# Patient Record
Sex: Male | Born: 1945
Health system: Northeastern US, Academic
[De-identification: ages and names within clinical notes are randomized; demographics above are authoritative.]

## PROBLEM LIST (undated history)

## (undated) DIAGNOSIS — Z85528 Personal history of other malignant neoplasm of kidney: Secondary | ICD-10-CM

## (undated) DIAGNOSIS — M1990 Unspecified osteoarthritis, unspecified site: Secondary | ICD-10-CM

## (undated) DIAGNOSIS — N19 Unspecified kidney failure: Secondary | ICD-10-CM

## (undated) DIAGNOSIS — E785 Hyperlipidemia, unspecified: Secondary | ICD-10-CM

## (undated) DIAGNOSIS — G4733 Obstructive sleep apnea (adult) (pediatric): Secondary | ICD-10-CM

## (undated) DIAGNOSIS — J449 Chronic obstructive pulmonary disease, unspecified: Secondary | ICD-10-CM

## (undated) DIAGNOSIS — I10 Essential (primary) hypertension: Secondary | ICD-10-CM

## (undated) HISTORY — PX: REVISION TOTAL HIP ARTHROPLASTY: SHX766

## (undated) HISTORY — PX: TONSILLECTOMY: SUR1361

## (undated) HISTORY — DX: Personal history of other malignant neoplasm of kidney: Z85.528

## (undated) HISTORY — DX: Hyperlipidemia, unspecified: E78.5

## (undated) HISTORY — DX: Obstructive sleep apnea (adult) (pediatric): G47.33

## (undated) HISTORY — DX: Unspecified kidney failure: N19

## (undated) HISTORY — DX: Essential (primary) hypertension: I10

## (undated) HISTORY — DX: Unspecified osteoarthritis, unspecified site: M19.90

## (undated) HISTORY — DX: Chronic obstructive pulmonary disease, unspecified: J44.9

---

## 1998-02-11 ENCOUNTER — Encounter: Admission: RE | Admit: 1998-02-11 | Discharge: 1998-05-12 | Payer: Self-pay | Admitting: *Deleted

## 1998-05-01 ENCOUNTER — Encounter: Admission: RE | Admit: 1998-05-01 | Discharge: 1998-07-30 | Payer: Self-pay | Admitting: *Deleted

## 1999-11-23 ENCOUNTER — Ambulatory Visit (HOSPITAL_COMMUNITY): Admission: RE | Admit: 1999-11-23 | Discharge: 1999-11-23 | Payer: Self-pay | Admitting: Orthopedic Surgery

## 1999-11-23 ENCOUNTER — Encounter: Payer: Self-pay | Admitting: Orthopedic Surgery

## 2000-02-16 ENCOUNTER — Encounter: Payer: Self-pay | Admitting: Specialist

## 2000-02-22 ENCOUNTER — Encounter: Payer: Self-pay | Admitting: Orthopedic Surgery

## 2000-02-22 ENCOUNTER — Inpatient Hospital Stay (HOSPITAL_COMMUNITY): Admission: RE | Admit: 2000-02-22 | Discharge: 2000-02-25 | Payer: Self-pay | Admitting: Orthopedic Surgery

## 2000-02-25 ENCOUNTER — Inpatient Hospital Stay (HOSPITAL_COMMUNITY)
Admission: RE | Admit: 2000-02-25 | Discharge: 2000-02-29 | Payer: Self-pay | Admitting: Physical Medicine & Rehabilitation

## 2000-02-29 ENCOUNTER — Encounter: Payer: Self-pay | Admitting: Orthopedic Surgery

## 2001-03-27 ENCOUNTER — Inpatient Hospital Stay (HOSPITAL_COMMUNITY): Admission: EM | Admit: 2001-03-27 | Discharge: 2001-03-31 | Payer: Self-pay | Admitting: Emergency Medicine

## 2001-03-27 ENCOUNTER — Encounter: Payer: Self-pay | Admitting: Internal Medicine

## 2001-03-29 ENCOUNTER — Encounter: Payer: Self-pay | Admitting: Internal Medicine

## 2003-05-01 ENCOUNTER — Ambulatory Visit (HOSPITAL_BASED_OUTPATIENT_CLINIC_OR_DEPARTMENT_OTHER): Admission: RE | Admit: 2003-05-01 | Discharge: 2003-05-01 | Payer: Self-pay | Admitting: Orthopedic Surgery

## 2003-05-28 ENCOUNTER — Ambulatory Visit (HOSPITAL_COMMUNITY): Admission: RE | Admit: 2003-05-28 | Discharge: 2003-05-28 | Payer: Self-pay | Admitting: Orthopedic Surgery

## 2003-06-03 ENCOUNTER — Encounter: Payer: Self-pay | Admitting: Orthopedic Surgery

## 2003-06-03 ENCOUNTER — Inpatient Hospital Stay (HOSPITAL_COMMUNITY): Admission: RE | Admit: 2003-06-03 | Discharge: 2003-06-07 | Payer: Self-pay | Admitting: Orthopedic Surgery

## 2003-06-06 ENCOUNTER — Encounter: Payer: Self-pay | Admitting: Orthopaedic Surgery

## 2003-06-07 ENCOUNTER — Inpatient Hospital Stay (HOSPITAL_COMMUNITY)
Admission: RE | Admit: 2003-06-07 | Discharge: 2003-06-13 | Payer: Self-pay | Admitting: Physical Medicine & Rehabilitation

## 2003-06-09 ENCOUNTER — Encounter: Payer: Self-pay | Admitting: Physical Medicine & Rehabilitation

## 2003-06-19 ENCOUNTER — Ambulatory Visit (HOSPITAL_COMMUNITY): Admission: RE | Admit: 2003-06-19 | Discharge: 2003-06-19 | Payer: Self-pay | Admitting: Orthopedic Surgery

## 2004-02-21 HISTORY — PX: SHOULDER SURGERY: SHX246

## 2004-03-03 ENCOUNTER — Ambulatory Visit (HOSPITAL_COMMUNITY): Admission: RE | Admit: 2004-03-03 | Discharge: 2004-03-04 | Payer: Self-pay | Admitting: Orthopedic Surgery

## 2005-08-23 HISTORY — PX: TOTAL KNEE ARTHROPLASTY: SHX125

## 2008-08-23 HISTORY — PX: NEPHRECTOMY: SHX65

## 2009-07-09 ENCOUNTER — Encounter: Admission: RE | Admit: 2009-07-09 | Discharge: 2009-07-09 | Payer: Self-pay | Admitting: Orthopaedic Surgery

## 2010-04-06 ENCOUNTER — Ambulatory Visit (HOSPITAL_COMMUNITY): Admission: RE | Admit: 2010-04-06 | Discharge: 2010-04-06 | Payer: Self-pay | Admitting: Orthopedic Surgery

## 2010-04-15 ENCOUNTER — Encounter: Admission: RE | Admit: 2010-04-15 | Discharge: 2010-04-15 | Payer: Self-pay | Admitting: Nephrology

## 2010-04-17 ENCOUNTER — Inpatient Hospital Stay (HOSPITAL_COMMUNITY): Admission: RE | Admit: 2010-04-17 | Discharge: 2010-04-27 | Payer: Self-pay | Admitting: Orthopedic Surgery

## 2010-04-17 ENCOUNTER — Ambulatory Visit: Payer: Self-pay | Admitting: Emergency Medicine

## 2010-06-10 ENCOUNTER — Emergency Department (HOSPITAL_COMMUNITY): Admission: EM | Admit: 2010-06-10 | Discharge: 2010-06-10 | Payer: Self-pay | Admitting: Emergency Medicine

## 2010-08-04 ENCOUNTER — Encounter
Admission: RE | Admit: 2010-08-04 | Discharge: 2010-09-22 | Payer: Self-pay | Source: Home / Self Care | Attending: Internal Medicine | Admitting: Internal Medicine

## 2010-09-12 ENCOUNTER — Encounter: Payer: Self-pay | Admitting: Orthopedic Surgery

## 2010-11-04 LAB — SYNOVIAL CELL COUNT + DIFF, W/ CRYSTALS
Eosinophils-Synovial: 0 % (ref 0–1)
Lymphocytes-Synovial Fld: 1 % (ref 0–20)
Neutrophil, Synovial: 82 % — ABNORMAL HIGH (ref 0–25)
Other Cells-SYN: 0

## 2010-11-04 LAB — CBC
MCH: 27.9 pg (ref 26.0–34.0)
MCHC: 31.9 g/dL (ref 30.0–36.0)
MCV: 87.5 fL (ref 78.0–100.0)
Platelets: 227 10*3/uL (ref 150–400)
RDW: 14.6 % (ref 11.5–15.5)
WBC: 12.3 10*3/uL — ABNORMAL HIGH (ref 4.0–10.5)

## 2010-11-04 LAB — DIFFERENTIAL
Basophils Absolute: 0 10*3/uL (ref 0.0–0.1)
Basophils Relative: 0 % (ref 0–1)
Eosinophils Absolute: 0.1 10*3/uL (ref 0.0–0.7)
Eosinophils Relative: 1 % (ref 0–5)
Monocytes Absolute: 1.3 10*3/uL — ABNORMAL HIGH (ref 0.1–1.0)
Neutro Abs: 9 10*3/uL — ABNORMAL HIGH (ref 1.7–7.7)

## 2010-11-04 LAB — BODY FLUID CULTURE: Culture: NO GROWTH

## 2010-11-05 LAB — GLUCOSE, CAPILLARY
Glucose-Capillary: 144 mg/dL — ABNORMAL HIGH (ref 70–99)
Glucose-Capillary: 152 mg/dL — ABNORMAL HIGH (ref 70–99)
Glucose-Capillary: 167 mg/dL — ABNORMAL HIGH (ref 70–99)
Glucose-Capillary: 193 mg/dL — ABNORMAL HIGH (ref 70–99)
Glucose-Capillary: 193 mg/dL — ABNORMAL HIGH (ref 70–99)
Glucose-Capillary: 200 mg/dL — ABNORMAL HIGH (ref 70–99)
Glucose-Capillary: 237 mg/dL — ABNORMAL HIGH (ref 70–99)
Glucose-Capillary: 246 mg/dL — ABNORMAL HIGH (ref 70–99)
Glucose-Capillary: 260 mg/dL — ABNORMAL HIGH (ref 70–99)

## 2010-11-05 LAB — CBC
HCT: 27.3 % — ABNORMAL LOW (ref 39.0–52.0)
HCT: 28.2 % — ABNORMAL LOW (ref 39.0–52.0)
Hemoglobin: 9 g/dL — ABNORMAL LOW (ref 13.0–17.0)
Hemoglobin: 9.3 g/dL — ABNORMAL LOW (ref 13.0–17.0)
MCHC: 32.7 g/dL (ref 30.0–36.0)
MCV: 83.5 fL (ref 78.0–100.0)
MCV: 83.9 fL (ref 78.0–100.0)
MCV: 85.2 fL (ref 78.0–100.0)
Platelets: 156 10*3/uL (ref 150–400)
Platelets: 203 10*3/uL (ref 150–400)
Platelets: 252 10*3/uL (ref 150–400)
RBC: 3.25 MIL/uL — ABNORMAL LOW (ref 4.22–5.81)
RBC: 3.27 MIL/uL — ABNORMAL LOW (ref 4.22–5.81)
RBC: 3.31 MIL/uL — ABNORMAL LOW (ref 4.22–5.81)
RBC: 3.36 MIL/uL — ABNORMAL LOW (ref 4.22–5.81)
WBC: 10 10*3/uL (ref 4.0–10.5)
WBC: 8.1 10*3/uL (ref 4.0–10.5)
WBC: 8.4 10*3/uL (ref 4.0–10.5)

## 2010-11-05 LAB — BASIC METABOLIC PANEL
Calcium: 8.3 mg/dL — ABNORMAL LOW (ref 8.4–10.5)
Chloride: 106 mEq/L (ref 96–112)
Chloride: 109 mEq/L (ref 96–112)
Chloride: 110 mEq/L (ref 96–112)
Creatinine, Ser: 2.12 mg/dL — ABNORMAL HIGH (ref 0.4–1.5)
Creatinine, Ser: 2.57 mg/dL — ABNORMAL HIGH (ref 0.4–1.5)
Creatinine, Ser: 2.78 mg/dL — ABNORMAL HIGH (ref 0.4–1.5)
GFR calc Af Amer: 28 mL/min — ABNORMAL LOW (ref >60)
GFR calc Af Amer: 31 mL/min — ABNORMAL LOW (ref >60)
GFR calc Af Amer: 31 mL/min — ABNORMAL LOW (ref >60)
GFR calc Af Amer: 38 mL/min — ABNORMAL LOW (ref >60)
GFR calc non Af Amer: 25 mL/min — ABNORMAL LOW (ref >60)
Glucose, Bld: 169 mg/dL — ABNORMAL HIGH (ref 70–99)
Potassium: 4.1 mEq/L (ref 3.5–5.1)
Potassium: 4.1 mEq/L (ref 3.5–5.1)
Potassium: 4.2 mEq/L (ref 3.5–5.1)
Sodium: 134 mEq/L — ABNORMAL LOW (ref 135–145)
Sodium: 135 mEq/L (ref 135–145)

## 2010-11-05 LAB — PROTIME-INR
INR: 1.78 — ABNORMAL HIGH (ref 0.00–1.49)
Prothrombin Time: 20.9 seconds — ABNORMAL HIGH (ref 11.6–15.2)

## 2010-11-06 LAB — CBC
HCT: 31 % — ABNORMAL LOW (ref 39.0–52.0)
HCT: 34.7 % — ABNORMAL LOW (ref 39.0–52.0)
HCT: 41.4 % (ref 39.0–52.0)
HCT: 44.4 % (ref 39.0–52.0)
Hemoglobin: 11.2 g/dL — ABNORMAL LOW (ref 13.0–17.0)
Hemoglobin: 12.4 g/dL — ABNORMAL LOW (ref 13.0–17.0)
Hemoglobin: 13.6 g/dL (ref 13.0–17.0)
Hemoglobin: 14.7 g/dL (ref 13.0–17.0)
MCH: 27.5 pg (ref 26.0–34.0)
MCH: 27.9 pg (ref 26.0–34.0)
MCH: 28.3 pg (ref 26.0–34.0)
MCH: 28.5 pg (ref 26.0–34.0)
MCH: 28.8 pg (ref 26.0–34.0)
MCHC: 31.3 g/dL (ref 30.0–36.0)
MCHC: 32.3 g/dL (ref 30.0–36.0)
MCHC: 32.9 g/dL (ref 30.0–36.0)
MCHC: 33.1 g/dL (ref 30.0–36.0)
MCV: 85.4 fL (ref 78.0–100.0)
MCV: 86.6 fL (ref 78.0–100.0)
MCV: 87 fL (ref 78.0–100.0)
MCV: 87.6 fL (ref 78.0–100.0)
Platelets: 171 10*3/uL (ref 150–400)
RBC: 4.44 MIL/uL (ref 4.22–5.81)
RDW: 13.1 % (ref 11.5–15.5)
RDW: 13.6 % (ref 11.5–15.5)
RDW: 13.3 % (ref 11.5–15.5)
WBC: 15.1 10*3/uL — ABNORMAL HIGH (ref 4.0–10.5)

## 2010-11-06 LAB — BASIC METABOLIC PANEL
BUN: 23 mg/dL (ref 6–23)
BUN: 25 mg/dL — ABNORMAL HIGH (ref 6–23)
CO2: 23 mEq/L (ref 19–32)
CO2: 23 mEq/L (ref 19–32)
CO2: 25 mEq/L (ref 19–32)
Calcium: 8.1 mg/dL — ABNORMAL LOW (ref 8.4–10.5)
Calcium: 8.3 mg/dL — ABNORMAL LOW (ref 8.4–10.5)
Chloride: 106 mEq/L (ref 96–112)
Chloride: 109 mEq/L (ref 96–112)
Chloride: 114 mEq/L — ABNORMAL HIGH (ref 96–112)
Creatinine, Ser: 1.7 mg/dL — ABNORMAL HIGH (ref 0.4–1.5)
Creatinine, Ser: 1.87 mg/dL — ABNORMAL HIGH (ref 0.4–1.5)
GFR calc Af Amer: 44 mL/min — ABNORMAL LOW (ref >60)
GFR calc non Af Amer: 29 mL/min — ABNORMAL LOW (ref >60)
Glucose, Bld: 137 mg/dL — ABNORMAL HIGH (ref 70–99)
Glucose, Bld: 185 mg/dL — ABNORMAL HIGH (ref 70–99)
Glucose, Bld: 219 mg/dL — ABNORMAL HIGH (ref 70–99)
Potassium: 5.2 mEq/L — ABNORMAL HIGH (ref 3.5–5.1)
Sodium: 138 mEq/L (ref 135–145)

## 2010-11-06 LAB — CROSSMATCH
ABO/RH(D): A POS
Antibody Screen: NEGATIVE

## 2010-11-06 LAB — GLUCOSE, CAPILLARY
Glucose-Capillary: 101 mg/dL — ABNORMAL HIGH (ref 70–99)
Glucose-Capillary: 141 mg/dL — ABNORMAL HIGH (ref 70–99)
Glucose-Capillary: 141 mg/dL — ABNORMAL HIGH (ref 70–99)
Glucose-Capillary: 159 mg/dL — ABNORMAL HIGH (ref 70–99)
Glucose-Capillary: 187 mg/dL — ABNORMAL HIGH (ref 70–99)
Glucose-Capillary: 193 mg/dL — ABNORMAL HIGH (ref 70–99)
Glucose-Capillary: 226 mg/dL — ABNORMAL HIGH (ref 70–99)
Glucose-Capillary: 243 mg/dL — ABNORMAL HIGH (ref 70–99)
Glucose-Capillary: 247 mg/dL — ABNORMAL HIGH (ref 70–99)
Glucose-Capillary: 271 mg/dL — ABNORMAL HIGH (ref 70–99)
Glucose-Capillary: 322 mg/dL — ABNORMAL HIGH (ref 70–99)

## 2010-11-06 LAB — COMPREHENSIVE METABOLIC PANEL
ALT: 24 U/L (ref 0–53)
BUN: 51 mg/dL — ABNORMAL HIGH (ref 6–23)
BUN: 75 mg/dL — ABNORMAL HIGH (ref 6–23)
CO2: 22 mEq/L (ref 19–32)
CO2: 20 mEq/L (ref 19–32)
Calcium: 9.7 mg/dL (ref 8.4–10.5)
Calcium: 9.6 mg/dL (ref 8.4–10.5)
Creatinine, Ser: 2.12 mg/dL — ABNORMAL HIGH (ref 0.4–1.5)
Creatinine, Ser: 3 mg/dL — ABNORMAL HIGH (ref 0.4–1.5)
GFR calc Af Amer: 38 mL/min — ABNORMAL LOW (ref >60)
GFR calc non Af Amer: 32 mL/min — ABNORMAL LOW (ref >60)
GFR calc non Af Amer: 21 mL/min — ABNORMAL LOW (ref >60)
Glucose, Bld: 323 mg/dL — ABNORMAL HIGH (ref 70–99)
Glucose, Bld: 58 mg/dL — ABNORMAL LOW (ref 70–99)
Sodium: 137 mEq/L (ref 135–145)
Total Bilirubin: 0.7 mg/dL (ref 0.3–1.2)
Total Protein: 7.6 g/dL (ref 6.0–8.3)

## 2010-11-06 LAB — DIFFERENTIAL
Eosinophils Absolute: 0.3 10*3/uL (ref 0.0–0.7)
Lymphocytes Relative: 23 % (ref 12–46)
Lymphs Abs: 1.5 10*3/uL (ref 0.7–4.0)
Lymphs Abs: 2.5 10*3/uL (ref 0.7–4.0)
Monocytes Absolute: 0.6 10*3/uL (ref 0.1–1.0)
Monocytes Relative: 7 % (ref 3–12)
Monocytes Relative: 10 % (ref 3–12)
Neutro Abs: 5.6 10*3/uL (ref 1.7–7.7)
Neutro Abs: 7 10*3/uL (ref 1.7–7.7)
Neutrophils Relative %: 72 % (ref 43–77)
Neutrophils Relative %: 64 % (ref 43–77)

## 2010-11-06 LAB — RENAL FUNCTION PANEL
Albumin: 3.3 g/dL — ABNORMAL LOW (ref 3.5–5.2)
BUN: 26 mg/dL — ABNORMAL HIGH (ref 6–23)
BUN: 28 mg/dL — ABNORMAL HIGH (ref 6–23)
CO2: 24 mEq/L (ref 19–32)
CO2: 24 mEq/L (ref 19–32)
Chloride: 110 mEq/L (ref 96–112)
Chloride: 111 mEq/L (ref 96–112)
Creatinine, Ser: 1.74 mg/dL — ABNORMAL HIGH (ref 0.4–1.5)
Glucose, Bld: 250 mg/dL — ABNORMAL HIGH (ref 70–99)
Potassium: 4.9 mEq/L (ref 3.5–5.1)

## 2010-11-06 LAB — PROTIME-INR
INR: 0.99 (ref 0.00–1.49)
INR: 1.53 — ABNORMAL HIGH (ref 0.00–1.49)
INR: 1.06 (ref 0.00–1.49)
Prothrombin Time: 18.6 seconds — ABNORMAL HIGH (ref 11.6–15.2)
Prothrombin Time: 14 seconds (ref 11.6–15.2)

## 2010-11-06 LAB — ABO/RH: ABO/RH(D): A POS

## 2010-11-06 LAB — POCT I-STAT 4, (NA,K, GLUC, HGB,HCT)
Hemoglobin: 14.6 g/dL (ref 13.0–17.0)
Potassium: 6.4 mEq/L (ref 3.5–5.1)
Sodium: 137 mEq/L (ref 135–145)

## 2010-11-06 LAB — APTT
aPTT: 25 seconds (ref 24–37)
aPTT: 26 seconds (ref 24–37)

## 2010-11-06 LAB — SURGICAL PCR SCREEN: Staphylococcus aureus: NEGATIVE

## 2010-11-16 ENCOUNTER — Telehealth: Payer: Self-pay | Admitting: Internal Medicine

## 2010-11-16 NOTE — Telephone Encounter (Signed)
Spoke with pt. He is c/o prod cough with blood tinged sputum and heaviness in chest x 4 days.  Would like appt.

## 2010-11-16 NOTE — Telephone Encounter (Signed)
Advised that the first available is in April and he needs to go to [facility] or ER for eval now.  He verbalized understanding and will go to UC or ER and appt for consult with MW sched for 12/03/10.

## 2010-12-03 ENCOUNTER — Ambulatory Visit (INDEPENDENT_AMBULATORY_CARE_PROVIDER_SITE_OTHER): Payer: Medicare Other | Admitting: Internal Medicine

## 2010-12-03 ENCOUNTER — Encounter: Payer: Self-pay | Admitting: Internal Medicine

## 2010-12-03 ENCOUNTER — Emergency Department (INDEPENDENT_AMBULATORY_CARE_PROVIDER_SITE_OTHER): Payer: Medicare Other

## 2010-12-03 ENCOUNTER — Emergency Department (HOSPITAL_BASED_OUTPATIENT_CLINIC_OR_DEPARTMENT_OTHER)
Admission: EM | Admit: 2010-12-03 | Discharge: 2010-12-03 | Disposition: A | Discharge status: Home / Self Care | Payer: Medicare Other | Attending: Emergency Medicine | Admitting: Emergency Medicine

## 2010-12-03 ENCOUNTER — Ambulatory Visit (INDEPENDENT_AMBULATORY_CARE_PROVIDER_SITE_OTHER)
Admission: RE | Admit: 2010-12-03 | Discharge: 2010-12-03 | Disposition: A | Discharge status: Home / Self Care | Payer: Medicare Other | Source: Ambulatory Visit | Attending: Internal Medicine | Admitting: Internal Medicine

## 2010-12-03 ENCOUNTER — Institutional Professional Consult (permissible substitution): Payer: Self-pay | Admitting: Internal Medicine

## 2010-12-03 VITALS — BP 108/64 | HR 102 | Temp 98.9°F | Ht 73.0 in | Wt 348.0 lb

## 2010-12-03 DIAGNOSIS — Y92009 Unspecified place in unspecified non-institutional (private) residence as the place of occurrence of the external cause: Secondary | ICD-10-CM | POA: Insufficient documentation

## 2010-12-03 DIAGNOSIS — J449 Chronic obstructive pulmonary disease, unspecified: Secondary | ICD-10-CM

## 2010-12-03 DIAGNOSIS — I10 Essential (primary) hypertension: Secondary | ICD-10-CM

## 2010-12-03 DIAGNOSIS — E119 Type 2 diabetes mellitus without complications: Secondary | ICD-10-CM | POA: Insufficient documentation

## 2010-12-03 DIAGNOSIS — M25529 Pain in unspecified elbow: Secondary | ICD-10-CM | POA: Insufficient documentation

## 2010-12-03 DIAGNOSIS — W010XXA Fall on same level from slipping, tripping and stumbling without subsequent striking against object, initial encounter: Secondary | ICD-10-CM

## 2010-12-03 DIAGNOSIS — Y93E5 Activity, floor mopping and cleaning: Secondary | ICD-10-CM | POA: Insufficient documentation

## 2010-12-03 DIAGNOSIS — S52123A Displaced fracture of head of unspecified radius, initial encounter for closed fracture: Secondary | ICD-10-CM | POA: Insufficient documentation

## 2010-12-03 DIAGNOSIS — Z79899 Other long term (current) drug therapy: Secondary | ICD-10-CM | POA: Insufficient documentation

## 2010-12-03 DIAGNOSIS — E7800 Pure hypercholesterolemia, unspecified: Secondary | ICD-10-CM | POA: Insufficient documentation

## 2010-12-03 MED ORDER — BUDESONIDE-FORMOTEROL FUMARATE 160-4.5 MCG/ACT IN AERO: 2.0000 | INHALATION_SPRAY | Freq: Two times a day (BID) | RESPIRATORY_TRACT | Status: DC

## 2010-12-03 NOTE — Progress Notes (Signed)
  Subjective:    Patient ID: Philip Cain, male    DOB: [birth year], 65 y.o.   MRN: [id number]  HPI 53 yowm quit smoking around 1996 with morbid obesity seen in consultation for sob sp  L Hip Aug 2011 and stopped ace and coreg > to rehab p surgery and improved.  12/03/2010 ov cc sob.  After rehab completed Sept 2011 could walk maybe an aisle at [employer] due to fatigue and sob/ slept ok s 02 most nights and no need for cpap on side ok.  Did not resume ace or coreg and gradually worse exp to wife's smoke at home, more cough congestion with white mucus indolent onset and slowly worse since discharge from rehab. No purulent sputum.  Marland Kitchenassoc with mild chest tightness, all symptoms some better w/in 15 min saba despite poor mdi.  Pt denies any significant sore throat, dysphagia, itching, sneezing,  nasal congestion or excess/ purulent secretions,  fever, chills, sweats, unintended wt loss, pleuritic or exertional cp, hempoptysis, orthopnea pnd or leg swelling.    Also denies any obvious fluctuation of symptoms with weather or environmental changes or other aggravating or alleviating factors.    Review of Systems  Constitutional: Negative for fever, chills, activity change, appetite change and unexpected weight change.  HENT: Negative for congestion, sore throat, rhinorrhea, sneezing, trouble swallowing, dental problem, voice change and postnasal drip.   Eyes: Negative for visual disturbance.  Respiratory: Positive for cough and shortness of breath. Negative for choking.   Cardiovascular: Positive for chest pain. Negative for leg swelling.  Gastrointestinal: Negative for nausea, vomiting and abdominal pain.  Genitourinary: Negative for difficulty urinating.  Musculoskeletal: Positive for arthralgias.  Skin: Negative for rash.  Psychiatric/Behavioral: Negative for behavioral problems and confusion.       Objective:   Physical Exam    obese wm nad  Wt 348 12/03/2010  HEENT mild turbinate  edema.  Oropharynx no thrush or excess pnd or cobblestoning.  No JVD or cervical adenopathy. Mild accessory muscle hypertrophy. Trachea midline, nl thryroid. Chest was hyperinflated by percussion with diminished breath sounds and moderate increased exp time without wheeze. Hoover sign positive at mid inspiration. Regular rate and rhythm without murmur gallop or rub or increase P2 or edema.  Abd:  Massively obese with huge pannus and no hsm . Ext warm without cyanosis or clubbing.      Assessment & Plan:

## 2010-12-03 NOTE — Patient Instructions (Addendum)
Symbicort 160 Take 2 puffs first thing in am and then another 2 puffs about 12 hours later.     Work on inhaler technique:  relax and gently blow all the way out then take a nice smooth deep breath back in, triggering the inhaler at same time you start breathing in.  Hold for up to 5 seconds if you can.  Rinse and gargle with water when done   If your mouth or throat starts to bother you,   I suggest you time the inhaler to your dental care and after using the inhaler(s) brush teeth and tongue with a baking soda containing toothpaste and when you rinse this out, gargle with it first to see if this helps your mouth and throat.     Please schedule a follow up office visit in 4  weeks, call sooner if needed with pft's on return

## 2010-12-03 NOTE — Assessment & Plan Note (Signed)
Strongly prefer in this setting: Bystolic, the most beta -1  selective Beta blocker available in sample form, with bisoprolol the most selective generic choice  on the market.  Otherwise risk spillover B2 effects on the airways

## 2010-12-03 NOTE — Assessment & Plan Note (Signed)
Very high risk pulmonary complications including osa/ ohs and gerd.

## 2010-12-03 NOTE — Assessment & Plan Note (Addendum)
  When respiratory symptoms begin well after a patient reports complete smoking cessation,  it is very hard to "blame" COPD specifically or airways disorders in general  ie it doesn't make any more sense than hearing a  [employer] driver wrecked his car while driving his kids to school or a surgeon sliced his hand off carving roast beef (it must be rare indeed!)     That is to say, once the high risk activity stops,  the symptoms should not suddenly erupt or markedly worsen.  If so, the differential diagnosis should include  obesity/deconditioning,  LPR/Reflux/Aspiration syndromes,  occult CHF, or  especially side effect of medications commonly used in this population.   Obesity and adverse med effects definitely contributed to symptoms in past ? Now as well?  For now work on hfa and start symbicort 2bid pending pft's   The proper method of use, as well as anticipated side effects, of this metered-dose inhaler are discussed and demonstrated to the patient. Improved to 50% with coaching

## 2010-12-04 ENCOUNTER — Telehealth: Payer: Self-pay | Admitting: Internal Medicine

## 2010-12-04 NOTE — Telephone Encounter (Signed)
Call pt: Reviewed cxr and no acute change so no change in recommendations made at [facility]  Advised pt of cxr results and he verbalized understanding

## 2011-01-06 ENCOUNTER — Ambulatory Visit: Payer: Medicare Other | Admitting: Internal Medicine

## 2011-01-08 NOTE — Discharge Summary (Signed)
[place]. [facility]  Patient:    Philip Cain, Philip Cain                         MRN: [id number] Adm. Date:  [id number] Disc. Date: [id number] Attending:  Judie Petit Dictator:   Cornell Barman, P.A. CC:         Valetta Mole. Swords, M.D. [facility]   Discharge Summary  DISCHARGE DIAGNOSES: 1. Bilateral pneumonia. 2. Respiratory insufficiency. 3. Hypoxemia. 4. Chronic obstructive pulmonary disease. 5. Non-insulin-dependent diabetes mellitus. 6. Hemoptysis.  HISTORY OF PRESENT ILLNESS:  Mr. Atkison is a 65 year old white male who presented with complaints of arthralgias, myalgias, chills, and sweats for the past five days.  He also developed cough and wheeze and feels very weak.  The patient started on Augmentin two days prior to this admission.  He had this left from a previous prescription.  The patient complains of cough productive of yellow sputum.  PAST MEDICAL HISTORY: 1. Non-insulin-dependent diabetes mellitus. 2. Hypertension. 3. Obesity. 4. Anxiety. 5. Hyperlipidemia. 6. 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  HOSPITAL COURSE: #1 - INFECTIOUS DISEASE:  The patient presented with fever and evidence of bilateral pneumonia on chest x-ray.  The patient also had some hemoptysis. The patient was hypoxic, requiring increased O2 to maintain saturations.  He was started on Rocephin and Zithromax.  The patients condition improved; however, he continued to spike fevers.  Blood cultures remained negative. Sputum cultures remained negative.  However, we did change his antibiotics from Rocephin and Zithromax to [place].  The patient defervesced on Fortaz. The patient described less hemoptysis and less dyspnea.  Currently, the patient is on room air and is maintaining saturations of 91-92% on room air. The patients respiratory insufficiency and hypoxemia were likely secondary to his bilateral pneumonia but exacerbated also by his COPD.  The patients condition  has improved, and he is felt to be stable for discharge home.  #2 - NON-INSULIN-DEPENDENT DIABETES MELLITUS:  The patient was on multiple oral hypoglycemic agents as an outpatient.  Many of these have been held during this hospitalization.  His Glucophage was held because he did receive a CT of the chest.  His Glucotrol was discontinued because he continued to have hypoglycemia.  Diabetic coordinator did talk with the patient and emphasized to the patient how well controlled his blood sugars were in the hospital by monitoring his food intake.  We do have him on reduced doses of his medications and will have him continue this regimen at discharge, however, anticipating he may need to have these medications increased and resumed.  DISCHARGE LABORATORY DATA:  Blood cultures remained negative x 2.  PSA was 0.16.  Hemoglobin 13.7.  CMET essentially normal.  Sputum culture was negative.  Chest CT showed no definite pulmonary embolus, but large areas of infiltrate with atelectasis and consolidation in the right lower lobe.  DISCHARGE MEDICATIONS: 1. Accupril 40 mg b.i.d. 2. Zoloft 50 mg q.d. 3. Glucophage 500 mg b.i.d. 4. Avandia 8 mg q.d. 5. Tiazac 300 mg q.h.s. 6. Guaifenesin 600 mg 2 tablets b.i.d. for six days. 7. Tequin 400 mg q.d. for six days. 8. The patient has been instructed not to take his Glucotrol until further    advised by Dr. Cato Mulligan.  DISCHARGE INSTRUCTIONS:  The patient is to check his blood cultures and document these for Dr. Cato Mulligan.  FOLLOW-UP:  The patient is to follow up with Dr. Cato Mulligan in one to two weeks.  DD:  03/31/01 TD:  04/01/01 Job: [id number]

## 2011-01-08 NOTE — Discharge Summary (Signed)
[place]. [facility]  Patient:    Philip Cain, Philip Cain                         MRN: [id number] Adm. Date:  [id number] Disc. 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  PRINCIPLE PROCEDURE:  Right total hip replacement.  Surgeon - Dr. Luiz Blare.  HISTORY OF PRESENT ILLNESS:  Philip Cain is a 65 year old male with severe right hip pain dating back to when he was a child when he had surgery for the hip x 2 - one to put some hardware in and one to remove it.  It sounds like he had a slipped capital femoral epiphysis although the details of this are not known.  MEDICATIONS ON ADMISSION:  Glucotrol, Glucophage, Avandia, Tiazac, Accupril, Paxil, Tylox, and Ambien.  PAST MEDICAL HISTORY:  Unremarkable other than obesity.  He had some mild history of hypertension.  COPD.  Non-insulin-dependent diabetes mellitus for which he occasionally takes insulin.  PHYSICAL EXAMINATION:  VITALS:  On admission, he was afebrile, his pulse was 82, respirations 16, blood pressure 126/60.  GENERAL:  He was well-developed, well-nourished, obese male in no acute distress.  HEENT:  PERRL, EOMI.  NECK:  Supple.  CHEST:  Clear to auscultation.  HEART:  Regular rate and rhythm.  ABDOMEN:  Soft and nontender.  He had well-healed scars and he was obese obviously.  EXTREMITIES:  He had tenderness to palpation with all range of motion of the hip and had internal rotation to about 0 degrees.  He had an obvious 15-degree flexion contracture and pain with all range of motion of the hip.  He had a Trendelenburg gait.  His skin was warm and dry.  HOSPITAL COURSE:  The patient was admitted to the hospital and underwent thorough preoperative evaluation for which he was found to be an operative candidate.  He was  taken to the operating room where he underwent a right total hip replacement.  It went uneventfully.  The patient postoperatively did extremely well.  He was up and moving around relatively quickly.  He did have some initial problems with control of his blood pressure and did have some problems with his potassium getting up relatively high to about 3.6.  He had initially had a bump in BUN and creatinine of 31 and 1.8.  These were trending down over the next several days, the patient did not need a blood transfusion, and his hematocrit leveled out with a hematocrit of 28.9 by the fourth postoperative day.  At that point, he was evaluated by the rehabilitation service and felt to be a good candidate.  At that point, he was able to bear moderate weight about 50% on the hip and was able to walk out to the door. The patient was transferred to the rehabilitation service at that point in excellent condition with a stable hemoglobin and hematocrit, potassium had resolved to 4.5, BUN and creatinine were down to 23 and 1.1 by the time of discharge to the rehabilitation service.  The patient will be followed up at two weeks at the office for staple removal and will be followed while he is in the hospital on the  rehabilitation service.  CONDITION ON DISCHARGE:  His condition at the time of discharge was stable.  DISCHARGE DIAGNOSIS:  Status post total hip replacement, doing well. DD:  04/15/00 TD:  04/16/00 Job: [id number]

## 2011-01-08 NOTE — Op Note (Signed)
NAMEJULEZ, Philip Cain                            ACCOUNT NO.:  [id number]   MEDICAL RECORD NO.:  [id number]                   PATIENT TYPE:  AMB   LOCATION:  DSC                                  FACILITY:  [facility]   PHYSICIAN:  Harvie Junior, M.D.                DATE OF BIRTH:  April 21, 1946   DATE OF PROCEDURE:  05/01/2003  DATE OF DISCHARGE:                                 OPERATIVE REPORT   PREOPERATIVE DIAGNOSIS:  1. Medial meniscal tear with degenerative joint disease and patellofemoral     chondromalacia.  2. Plantar fasciitis, left.   POSTOPERATIVE DIAGNOSIS:  1. Medial meniscal tear with degenerative joint disease and patellofemoral     chondromalacia.  2. Loose body.   OPERATION PERFORMED:  1. Partial posterior horn medial meniscectomy with debridement of medial     femoral compartment.  2. Debridement of chondromalacia patella on both the patellar and trochlear     side.  3. Removal of large osteocartilaginous loose body.  4. Injection of left plantar fascia.   SURGEON:  Harvie Junior, M.D.   ASSISTANT:  Marshia Ly, P.A.   ANESTHESIA:  General.   INDICATIONS FOR PROCEDURE:  The patient is a 65 year old male with a long  history of having significant pain in the left knee.  He had been treated  with anti-inflammatory medication, activity modification, injection therapy,  and all failed.  Because of this, he is brought to the operating room for  operative knee arthroscopy.   DESCRIPTION OF PROCEDURE:  The patient was brought to the operating room.  After adequate anesthesia was obtained with general anesthetic, the patient  was placed supine on the operating table.  The left leg was prepped and  draped in the usual sterile fashion.  Following this, routine arthroscopic  examination of the knee revealed that there was obvious grade 3 changes in  the patellofemoral joint.  This was debrided back to a smooth and stable  rim.  The plica was removed medially and  laterally.  Attention was turned to  the medial femoral compartment where there was noted to be some grade 4  changes on the medial femoral condyle about 4 square cm. over the medial  tibial plateau about 2 square cm.  There was a posterior horn medial  meniscal tear which was debrided.  Attention was turned to the notch where  there was a large osteocartilaginous loose body which was removed with a  grasper.  Attention was turned laterally where surprisingly, the lateral  compartment looked reasonably okay.  The meniscus was within normal limits  and the condyle showed some indentation but no evidence of frank break down.  Attention was turned back to the notch where the anterior cruciate was again  noted to be normal.  Medially, a final check was made with a probe  posteriorly.  The posterior horn had been debrided adequately.  The medial  femoral condyle and the medial tibial plateau were debrided although there  was that large bone-on-bone area that was discussed earlier.  Attention was  turned back up into the patellofemoral compartment where some debridement of  the trochlea and patella were again undertaken to try to smooth this area  out.  Following this, the knee was copiously irrigated and suctioned dry.  The arthroscopic portals were closed with bandage, sterile compressive  dressing was applied.  The patient was then transferred to the recovery room  where he was noted to be in satisfactory condition.  The estimated blood  loss for this procedure was none.                                                Harvie Junior, M.D.    Ranae Plumber  D:  05/01/2003  T:  05/01/2003  Job:  [id number]

## 2011-01-08 NOTE — Discharge Summary (Signed)
NAMEMYRLE, DUES                            ACCOUNT NO.:  [id number]   MEDICAL RECORD NO.:  [id number]                   PATIENT TYPE:  INP   LOCATION:  5022                                 FACILITY:  [facility]   PHYSICIAN:  Harvie Junior, M.D.                DATE OF BIRTH:  04-Jul-1946   DATE OF ADMISSION:  06/03/2003  DATE OF DISCHARGE:  06/07/2003                                 DISCHARGE SUMMARY   ADMISSION DIAGNOSES:  1. Degenerative joint disease, left knee.  2. Type 2 diabetes mellitus.  3. Hypertension.  4. Chronic depression.  5. Obesity.   DISCHARGE DIAGNOSES:  1. Degenerative joint disease, left knee.  2. Type 2 diabetes mellitus.  3. Hypertension.  4. Chronic depression.  5. Obesity.   PROCEDURES IN HOSPITAL:  Left total knee arthroplasty, Jodi Geralds, MD, on  June 03, 2003.   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  He has pain with  ambulation and night pain.  He had left knee arthroscopy back in September.  These showed significant DJD.  Standing x-rays of the left knee showed bone-  on-bone arthritis.  He got really no relief and actually worsening of the  knee pain after the arthroscopy.  He had night pain and pain with ambulation  and basically could not walk without significant pain.  There were no signs  of infection or other untoward features after his knee arthroscopy, and he  was felt to be a candidate for a left total knee arthroplasty.  He was  admitted for this.   PERTINENT LABORATORY STUDIES:  Preop hemoglobin was 13.3 with hematocrit of  39.  Blood pressure 128/71.  His potassium was 4.6.  Urinalysis showed no  abnormalities.   On postop day #1, his hemoglobin was 11.9 with a hematocrit of 35.2.  Potassium was 4.9.  Sodium 141.  BUN 29, creatinine 1.1.  His pro time was  14.1 seconds with an INR of 1.1.  On postop day #2, his hemoglobin was 10.7  with a  hematocrit of 31.3.  INR was 1.3.   On postop day #3, his hemoglobin was 9.5 with an INR of 1.6.   HOSPITAL COURSE:  The patient underwent left total knee arthroplasty, as was  described in Dr. Luiz Blare' operative note on June 03, 2003.  Postoperatively, he was put on IV Ancef 1 gm q.8h. x6 doses, IV fluids, and  PCA morphine pump for pain control.  He is also started on Coumadin and  Lovenox for DVT prophylaxis, Lovenox until the INR was 2.0 or greater.  Incentive spirometry was ordered.   On postop day #1, he had moderate left knee pain.  He was taking fluids  without difficulty.  His Amos catheter was intact.  His vital signs were  stable.  He was  afebrile.  He had 450 cc of Hemovac output.  Physical  therapy saw the patient for walker ambulation, weightbearing as tolerated,  on the left side.   On postop day #2, he had complaints of knee pain.  He was out of bed to the  chair.  He was taking fluids without difficulty.  He was afebrile.  His  vital signs were stable.  His dressing was changed to the left knee, and the  Hemovac drains were pulled.  His IV was converted to a saline lock.  His  Arns catheter was discontinued.  He was started on OxyContin 40 mg b.i.d.   He continued to progress well.  They apparently had a concern as to whether  the patient was in bed asleep.  The CPM was on his left leg, and the machine  fell to the floor with patient's leg attached.  He has had some increased  knee pain since but overall really no change.  We got x-rays of the left  knee, which were unremarkable for any acute abnormalities.  He continued to  make excellent progress but was felt to be a good candidate for inpatient  rehab stay.   On June 07, 2003, the patient was making good progress.  Was felt to be a  candidate for inpatient rehabilitation.  He was admitted to the inpatient  unit.   DISCHARGE MEDICATIONS:  All medications preoperatively with the addition of  Coumadin per  pharmacy protocol, OxyContin 40 mg b.i.d., Percocet for  breakthrough pain.   ACTIVITY:  Weightbearing as tolerated on the left with a walker.  We will  see the patient on the inpatient unit.      Marshia Ly, P.A.                       Harvie Junior, M.D.    Cordelia Pen  D:  09/05/2003  T:  09/05/2003  Job:  [id number]   cc:   [facility], [place]

## 2011-01-08 NOTE — Discharge Summary (Signed)
[place]. [facility]  Patient:    Philip Cain, Philip Cain                         MRN: [id number] Adm. Date:  [id number] Disc. Date: [id number] Attending:  Herold Harms Dictator:   Bynum Bellows. Idacavage, P.A.C. CC:         Daniel L. Thomasena Edis, M.D.  Harvie Junior, M.D.  [facility], [facility]   Discharge Summary  DIAGNOSES: 1. Right total hip arthroplasty. 2. Hypertension. 3. Diabetes. 4. Obesity. 5. Postoperative anemia.  HISTORY OF PRESENT ILLNESS:  The patient is a 65 year old male with severe right hip pain, status post remote hip pinning and removal of hardware in 1961 and 1963, who elected to undergo right total hip arthroplasty February 22, 2000, by Dr. Luiz Blare.  He was placed on Coumadin for DVT prophylaxis.  He was allowed weightbearing as tolerated.  He did have acute renal insufficiency.  His Glucophage was held.  He did require IV fluids.  It was felt that he would require inpatient rehabilitation.  PAST MEDICAL HISTORY: 1. Hypertension. 2. Diabetes. 3. Obesity.  SOCIAL HISTORY:  The patient lives with his wife in a two-level house.  He can stay on the ground floor.  He does have three steps to enter.  HOSPITAL COURSE:  The patient was [facility] to [facility] on February 25, 2000.  At that time he was moderate assist with bed mobility and supervision for transfers.  He was also able to ambulate 10 feet with a wide walker.  Upon admission, he was evaluated by physical therapy and occupational therapies, and he was placed in a program in which he could receive at least three hours per day of physical and occupational therapies. The patient had no significant medical complications while he has been on the rehabilitation floor.  He progressed quite well with therapies, to the point where he was independent with bed mobility and transfers and able to ambulate 100 feet with a walker at an independent level.  His medications at  the time of discharge include:  DISCHARGE MEDICATIONS: 1. Trinsicon three times a day. 2. Percocet 1 or 2 every four hours as needed. 3. Coumadin 10 mg daily until March 21, 2000.  4. OxyContin 30 mg daily for seven days, then 20 mg daily for seven days,     then 10 mg daily for seven days.  5. Accupril 20 mg daily.  6. Tiazac 300 mg every 12 hours.  7. Paxil 20 mg daily.  8. Glucotrol XL 5 mg twice a day.  9. Glucophage XR 500 mg twice a day. 10. Avandia 8 mg daily.  ACTIVITY:  He is instructed to use a walker when ambulating.  DIET:  Follow a diabetic diet.  WOUND CARE:  Keep his wound clean and dry.  FOLLOW-UP:  Follow up with Dr. Luiz Blare in two weeks.  Dr. Ruthine Dose as instructed.  DISCHARGE INSTRUCTIONS:  He will have PT/INR Wednesday, results to Dr. Ruthine Dose.  CONDITION ON DISCHARGE:  Stable. DD:  03/22/00 TD:  03/23/00 Job: [id number]

## 2011-01-08 NOTE — H&P (Signed)
[place]. [facility]  Patient:    Philip Cain, Philip Cain                         MRN: [id number] Adm. Date:  04/05/01 Disc. Date: 04/05/01 Attending:  Valetta Mole. Swords, M.D. [facility]                         History and Physical  CHIEF COMPLAINT:  Shortness of breath.  HISTORY OF PRESENT ILLNESS:  Mr. Umstead is a 65 year old male with multiple medical problems.  Approximately five days ago he developed joint aches, myalgias, chills, and sweats.  Two days later he developed cough and wheeze, and began to feel very weak.  He also started Augmentin two days ago, which he says was "left over."  He said the Augmentin seems to have helped some, but he has noted worsening dyspnea on exertion.  His cough is productive of yellow sputum.  He has also had fever and chills.  He denies any chest pain.  PAST MEDICAL HISTORY: 1. Significant for diabetes mellitus. 2. Hypertension. 3. Obesity. 4. Anxiety. 5. Hyperlipidemia.  PAST SURGICAL HISTORY:  Total hip arthroplasty in July 2001.  FAMILY HISTORY:  Mother deceased with some sort of malignancy.  He does not know whether it was male cancer or leukemia.  Father has had strokes.  He has a sister who has had valve repairs.  SOCIAL HISTORY:  He works as a Agricultural consultant.  He is an Corporate investment banker. He does not drink alcohol.  Hs is married.  REVIEW OF SYSTEMS:  He admits to shortness of breath.  No chest pain.  He denies any abdominal pain.  His appetite seems to be maintained.  Denies any other complaints in the review of systems.  PHYSICAL EXAMINATION:  VITAL SIGNS:  Weight is greater than 350 pounds.  Temperature 102.3 degrees, pulse 142, respirations 32, blood pressure 140/70.  At the time of my examination his pulse is 105, respirations 20.  GENERAL:  He appears as a morbidly obese male, in no respiratory distress at rest.  With walking he does become quite short of breath.  He has audible wheezing.  NECK:  Supple,  without any lymphadenopathy or thyromegaly.  CHEST:  No accessory muscle use of breathing.  He has bilateral rhonchi, greater on the right, with dullness to percussion at the right base.  ABDOMEN:  He is morbidly obese, active bowel sounds, soft.  CARDIAC:  S1, S2 regular, rate 100-110.  EXTREMITIES:  There is 2+ edema to the midcalves.  No rashes are identified.  NEUROLOGIC:  He is alert and oriented.  Chest x-ray was obtained.  He has a significant right lower lobe pneumonia and also a left lower lobe pneumonia.  ASSESSMENT/PLAN:  Pneumonia with hypoxia.  I am concerned about the patient. He needs to be admitted.  He initially refused admission.  I told him that he would likely go home with a good chance of dying.  PLAN:  He will be admitted and started on antibiotics, including Rocephin and Zithromax, for community-acquired pneumonia.  He will also be treated with oxygen and albuterol nebulizers.  Although at this point I think he will respond to treatment, he understands the risks of pneumonia, including death. DD:  Apr 05, 2001 TD:  04-05-01 Job: [id number]

## 2011-01-08 NOTE — Op Note (Signed)
NAMEAARION, METZGAR                            ACCOUNT NO.:  [id number]   MEDICAL RECORD NO.:  [id number]                   PATIENT TYPE:  INP   LOCATION:  5022                                 FACILITY:  [facility]   PHYSICIAN:  Harvie Junior, M.D.                DATE OF BIRTH:  1946-07-08   DATE OF PROCEDURE:  06/03/2003  DATE OF DISCHARGE:                                 OPERATIVE REPORT   PREOPERATIVE DIAGNOSIS:  End-stage degenerative joint disease, left knee.   POSTOPERATIVE DIAGNOSIS:  End-stage degenerative joint disease, left knee.   PRINCIPAL PROCEDURE:  Left total knee replacement with DuPuy LCS  instrumentation.  1. A large femur.  2. Size 5 cemented keel tibia.  3. 10 mm bridging bearing.  4. 38 mm all-poly patella.   SURGEON:  Harvie Junior, M.D.   ASSISTANT:  Marshia Ly, P.A.   ANESTHESIA:  General.   ESTIMATED BLOOD LOSS:  None.   BRIEF HISTORY:  A 65 year old male with a long history of having significant  joint disease of the right hip, which had ultimately been fixed.  He has  been suffering of left knee.  Ultimately x-rays showed bone-on-bone  arthritis.  We attempted arthroscopic debridement of the knee, and the  patient had significant pain postoperatively.  We attempted to work him  through this pain, but ultimately he said the pain was unbearable and felt  that he needed to have a knee replacement.  He was brought to the operating  room for this procedure.   DESCRIPTION OF PROCEDURE:  The patient was brought to the operating room and  after adequate anesthesia was obtained, the patient was placed onto the  operating room table.  The left leg was prepped and draped in the usual  sterile fashion.  Following this an incision was made over the knee in the  midline, down to the level of the patella.  The medial parapatellar  arthrotomy was undertaken.  Medial and lateral meniscectomy was performed,  as well as a fat pad excision; anterior and posterior  cruciates were  excised.  From this point a cut was made perpendicular to the long axis of  the tibia, with 10-degree posterior slope.   The attention was then turned to the femur, where cuts were made  perpendicular to the femur.  The anterior and posterior cuts were made.  A  four-degree valgus distal alignment cut was made, and at this time the  flexion and extension gaps were matched at 10 mm.  At this point, the attention was turned to the chamfer cuts of the femur,  and the tibia was then excised and the keels were punched.   Following this, attention was turned to the patella, where a patella was  sized and cut down to a level of 13 mm.  The trial components were all put  in place at this point.  Range of motion was undertaken.  There was  excellent midline tracking of the patella, and had a tendency toward  subluxation.  Excellent range of motion was achieved at this point.   At this point all trial components were removed.  The knee was copiously  irrigated with pulsatile lavage irrigation and suction dried.  The three  components were then cemented into place (a large femur, size 5 cemented  keel tibia, 10 mm bridging bearing and a 38 mm all-poly patella).  The  cement was allowed to harden at this point, and a final range of motion  check was made.  Midline patellar tracking was achieved.   A medium Hemovac drain was then placed, and a medial parapatellar arthrotomy  was closed with a #1 Vicryl running, locking suture.  The subcutaneous was  then closed with 0 and 2-0 Vicryl and skin staples.  Sterile compressive  dressing was applied, as well as a knee immobilizer.  The patient was then  taken to the recovery room and noted to be in satisfactory condition.                                                 Harvie Junior, M.D.    Ranae Plumber  D:  06/03/2003  T:  06/04/2003  Job:  [id number]
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
## 2011-01-08 NOTE — Op Note (Signed)
[place]. [facility]  Patient:    Philip Cain, Philip Cain                         MRN: [id number] Proc. Date: 02/22/00 Adm. Date:  [id number] Attending:  Milly Jakob                           Operative Report  PREOPERATIVE DIAGNOSIS:  Degenerative joint disease of right hip, status post what sounds like a slipped capital femoral epiphysis with some sort of an open pinning procedure done many years ago.  POSTOPERATIVE DIAGNOSIS:  Degenerative joint disease of right hip, status post what sounds like a slipped capital femoral epiphysis with some sort of an open pinning procedure done many years ago.  PRINCIPAL PROCEDURE:  Right total hip replacement with SROM component, 18/13 stem with a 42 base neck and a +0 ball, a 58 acetabular component with a 10 degree high wall liner.  SURGEON:  Harvie Junior, M.D.  ASSISTANT:  Alinda Deem, M.D., Dorthula Matas, P.A.-C., and Jonelle Sidle. student.  BRIEF HISTORY:  The patient is a 65 year old morbidly obese male who presented with severe pain in the right hip, status post failure of all conservative modalities.  He underwent x-ray evaluation which showed him to have a severe arthritis in the right hip and after failure of conservative care, he was taken to the operating room for total hip replacement.  DESCRIPTION OF PROCEDURE:  The patient was brought to the operating room and after adequate anesthesia was obtained with a general anesthetic, the patient was placed supine on the operating table.  The right hip was then prepped and draped in the usual sterile fashion.  The patient was so large that he could not be placed into the hip positioners, and it was accepted, but there may be some difficulty with acetabular orientation, but it was felt that the positioners would not be able to be utilized.  An arm board had to be put in place, and to hold the _________ and to put him far enough forward to allow the hip  to come off the table so that it could be dislocated.  Following this, the right hip was prepped and draped in the usual sterile fashion after a Sukup catheter had been inserted, and after the patient had had 80 of gentamicin and a gram of Ancef.  Following this, the standard posterior incision was made to the hip after routine prepping and draping, and the subcutaneous tissue taken down to the level of the tensor fascia which was divided in line with its fibers.  The gluteus maximus muscle was then finger fractured to allow access to the posterior approach to the hip.  A McCullough retractor was then put in place just superior to the gluteus medius muscle, and then anteriorly to the neck. The piriformis was identified and tagged along with the short external rotators.  The posterior hip capsule was then divided and tagged.  Following this, a stay suture was put in place and the hip was dislocated and provisional neck cut was made at this point.  Following this, attention was turned towards the hip where the acetabulum was exposed and the labrectomy was performed.  The acetabulum was then sequentially reamed to a level of 57, and a size 58 cup was then put in place. A 10 degree _______ was then put into the cup and after  it was noted to be stable in all directions.  The reaming of the acetabulum, the acetabulum was medialized centrally to the lateral wall of the tear drop.  Excellent bleeding bone was achieved in all planes and an excellent placement of the cup was achieved with 45 degrees of lateral opening and about 20-25 degrees of anteversion.  Following this, attention was turned towards the stem, where the boxed osteotome was used to gain lateral access to the femur, and then a canal finder was used.  The femur was sequentially reamed to a level of 13, and then halfway with a 13.5.  Excellent bone cutting was achieved to this level.  Following this, the cone was sequentially  reamed up to a level of 59F, and then at 59F, a small cone was put in place.  A trial reduction was then undertaken with a 36 mm neck with a 13 stem, an 59F small cone, and a 36 ____.  Thus, anterior osteophytes were found.  The hip would dislocate at that point with about 80 degrees of flexion and about 30 degrees of internal rotation.  It was felt that achievement of a stable hip was necessary with him given his large weight and desired not to have any problem with dislocation, and so a 42 mm base neck was then chosen, and this was put in place.  Once the +0 ball was put on, the hip was then reduced and put through a range of motion.  Excellent stability was then achieved.  Following this and achievement of excellent stability, the patient was put through a range of motion and excellent stability had been achieved.  The hip was then copiously irrigated and suctioned dry, and at this point, the posterior capsule and short external rotators were reattached posteriorly to the trochanteric line, and the tensor fascia was then closed with a #1 Vicryl running suture.  The skin was then closed with a combination of 0 and 2-0 Vicryls, and the skin with skin staples.  A sterile and compressive dressing was applied, and the patient was then taken to the recovery room where he was noted to be in satisfactory condition. Estimated blood loss for the procedure was 1200 cc. DD:  02/22/00 TD:  02/23/00 Job: [id number]

## 2011-01-08 NOTE — Discharge Summary (Signed)
NAMEJAEVION, Philip Cain NO.:  [id number]   MEDICAL RECORD NO.:  [id number]                   PATIENT TYPE:  IPS   LOCATION:  4151                                 FACILITY:  [facility]   PHYSICIAN:  Ranelle Oyster, M.D.             DATE OF BIRTH:  05-15-1946   DATE OF ADMISSION:  06/07/2003  DATE OF DISCHARGE:  06/13/2003                                 DISCHARGE SUMMARY   DISCHARGE DIAGNOSES:  1. Left total knee replacement.  2. Diabetes mellitus.  3. Urinary tract infection.  4  Obesity.   HISTORY OF PRESENT ILLNESS:  Philip Cain is a 65 year old male with a history  of diabetes, hypertension, end-stage DJD of left knee, who elected to  undergo left total knee replacement, June 03, 2003, by Dr. Harvie Junior.  Postop, he was weightbearing as tolerated and on Lovenox and  Coumadin for DVT prophylaxis.  He has had some problems with CPM use and x-  rays of left knee done showed some bony fragments that were contributed to  postop changes.  Physical therapy initiated and patient at total assist plus  two, 70% to transfer, total assist plus two, 70% to ambulate 10 feet,  requiring O2 at 4 L.  Knee flexion is 84 degrees.  A trial of OxyContin for  pain control showed patient with increasing confusion and sedation.   PAST MEDICAL HISTORY:  Past medical history significant for:  1. Hypertension.  2. Obesity.  3. DM, type 2.  4. Left knee scope.  5. Right total hip replacement.  6. Right hip pinning.  7. Insomnia.  8. Asthma as a child.  9. Sinus headaches.  10.      Depression.   ALLERGIES:  Allergies are to [facility].   SOCIAL HISTORY:  The patient is married and lives in a one-level home with  no steps at entry, was independent and working prior to admission.  He does  not use any alcohol, quit tobacco eight years ago.   HOSPITAL COURSE:  Philip Cain was admitted to rehab on June 07, 2003  for inpatient therapies to consist of PT  and OT daily.  Past admission, he  was maintained on weightbearing as tolerated; this is on his left lower  extremity.  Diabetes was monitored with a.c./h.s. CBG checks and blood sugar  were noted to improve some at time of discharge, running from 118 to  occasional high in 190s.  P.o. intake has been good.  The patient's blood  pressures have been stable.  Labs done past admission showed hemoglobin  10.1, hematocrit 29.2, white count 7.3, platelets 267,000; sodium 137,  potassium 4.1, chloride 98, CO2 22, BUN 7, creatinine 0.9, glucose 201,  total protein 6.2, albumin 2.9.  A UA/UCS done showed 65,000 colonies of  Klebsiella and the patient was treated with Cipro for this.  Pain control  was  managed with use of Ultram and Vicodin.  A flu vaccine was administered  prior to discharge.  Followup left knee x-rays on June 09, 2003 showed  stable appearances of left knee arthroscopy with no acute findings.  During  his stay in rehab, Philip Cain progressed to being modified independent for  ADLs and during toileting; he required some supervision for tub-shower  transfers.  The patient was modified independent for transfers, modified  independent for ambulating 200 feet with a rolling walker, able to navigate  one step with supervision and verbal cues.  Knee flexion is at minus 10 to  88 degrees' activity.  Further followup therapies to include home health PT  and OT by [facility] with CPM ordered for continued range of motion  at home.  On June 13, 2003, the patient is discharged to home.   DISCHARGE MEDICATIONS:  1. Ambien 5 mg one to two nightly p.r.n.  2. Coumadin 2 mg q.p.m.  3. Glucophage 500 mg two p.o. b.i.d.  4. Trinsicon one p.o. b.i.d.  5. Glucotrol 10 mg per day.  6. Toprol-XL 100 mg b.i.d.  7. Accupril 40 mg b.i.d.  8. Lipitor 40 mg nightly.  9. Zoloft 50 mg a day.  10.      Cardizem CD 300 mg b.i.d.  11.      Avandia 8 mg a day.  12.      Robaxin 500 mg one to two  four times daily p.r.n. spasm.  13.      Ultram 50 mg one to two four times daily p.r.n. pain.  14.      Vicodin 5 mg one to two q.4-6 h. p.r.n. pain.   ACTIVITY:  Use walker.   DIET:  Diet is a diabetic diet, low salt.   SPECIAL INSTRUCTIONS:  No alcohol, no smoking, no driving; no aspirin or  aspirin-containing products while on Coumadin.  [facility] to provide  PT and R.N. with home health R.N. to draw pro time on June 17, 2003,  Coumadin to continue through July 04, 2003.   FOLLOWUP:  Patient to follow up with Dr. Luiz Blare in two weeks, follow up with  Dr. Ranelle Oyster as needed.      Philip Cain, P.A.                    Ranelle Oyster, M.D.    PP/MEDQ  D:  07/01/2003  T:  07/02/2003  Job:  [id number]   cc:   Rondall Allegra, [facility]   Harvie Junior, M.D.  [address]  [place]  [place] [address]  Fax: [phone]

## 2011-01-21 ENCOUNTER — Encounter: Payer: Self-pay | Admitting: Internal Medicine

## 2011-01-21 ENCOUNTER — Ambulatory Visit (INDEPENDENT_AMBULATORY_CARE_PROVIDER_SITE_OTHER): Payer: Medicare Other | Admitting: Internal Medicine

## 2011-01-21 VITALS — BP 154/78 | HR 112 | Temp 97.7°F | Ht 73.0 in | Wt 356.0 lb

## 2011-01-21 DIAGNOSIS — J449 Chronic obstructive pulmonary disease, unspecified: Secondary | ICD-10-CM

## 2011-01-21 DIAGNOSIS — G4733 Obstructive sleep apnea (adult) (pediatric): Secondary | ICD-10-CM | POA: Insufficient documentation

## 2011-01-21 DIAGNOSIS — I10 Essential (primary) hypertension: Secondary | ICD-10-CM

## 2011-01-21 LAB — PULMONARY FUNCTION TEST

## 2011-01-21 MED ORDER — BUDESONIDE-FORMOTEROL FUMARATE 160-4.5 MCG/ACT IN AERO: 2.0000 | INHALATION_SPRAY | Freq: Two times a day (BID) | RESPIRATORY_TRACT | Status: DC

## 2011-01-21 NOTE — Progress Notes (Signed)
  Subjective:    Patient ID: Philip Cain, male    DOB: Oct 19, 1945, 65 y.o.   MRN: [id number]  HPI 42 yowm quit smoking around 1996 with morbid obesity seen in consultation for sob sp  L Hip Aug 2011 and stopped ace and coreg > to rehab p surgery and improved.  12/03/2010 ov cc sob.  After rehab completed Sept 2011 could walk maybe an aisle at [employer] due to fatigue and sob/ slept ok s 02 most nights and no need for cpap on side ok.  Did not resume ace or coreg and gradually worse exp to wife's smoke at home, more cough congestion with white mucus indolent onset and slowly worse since discharge from rehab. No purulent sputum.  [facility] with mild chest tightness, all symptoms some better w/in 15 min saba despite poor mdi.  rec Symbicort 160 Take 2 puffs first thing in am and then another 2 puffs about 12 hours later.  Work on inhaler technique   01/21/2011 ov/ Omar Orrego   Cc sob Much better on symbicort than off and using more albuterol since ran out to control sob and cough, concerned with cost. Pt denies any significant sore throat, dysphagia, itching, sneezing,  nasal congestion or excess/ purulent secretions,  fever, chills, sweats, unintended wt loss, pleuritic or exertional cp, hempoptysis, orthopnea pnd or leg swelling.    Also denies any obvious fluctuation of symptoms with weather or environmental changes or other aggravating or alleviating factors.         Objective:   Physical Exam    obese wm nad  Wt 348 12/03/2010  > 356 01/21/11 HEENT mild turbinate edema.  Oropharynx no thrush or excess pnd or cobblestoning.  No JVD or cervical adenopathy. Mild accessory muscle hypertrophy. Trachea midline, nl thryroid. Chest was hyperinflated by percussion with diminished breath sounds and moderate increased exp time without wheeze. Hoover sign positive at mid inspiration. Regular rate and rhythm without murmur gallop or rub or increase P2, trace edema.  Abd:  Massively obese with huge pannus and no  hsm . Ext warm without cyanosis or clubbing.      Assessment & Plan:

## 2011-01-21 NOTE — Patient Instructions (Addendum)
Resume symbicort 160 Take 2 puffs first thing in am and then another 2 puffs about 12 hours later.   GERD (REFLUX)  is an extremely common cause of respiratory symptoms, many times with no significant heartburn at all.    It can be treated with medication, but also with lifestyle changes including avoidance of late meals, excessive alcohol, smoking cessation, and avoid fatty foods, chocolate, peppermint, colas, red wine, and acidic juices such as orange juice.  NO MINT OR MENTHOL PRODUCTS SO NO COUGH DROPS  USE SUGARLESS CANDY INSTEAD (jolley ranchers or Stover's)  NO OIL BASED VITAMINS   Work on inhaler technique:  relax and gently blow all the way out then take a nice smooth deep breath back in, triggering the inhaler at same time you start breathing in.  Hold for up to 5 seconds if you can.  Rinse and gargle with water when done   If your mouth or throat starts to bother you,   I suggest you time the inhaler to your dental care and after using the inhaler(s) brush teeth and tongue with a baking soda containing toothpaste and when you rinse this out, gargle with it first to see if this helps your mouth and throat.      only use nebulizer if needed to back up the symbicort   Schedule a follow up appt with one of our sleep doctors but bring the equipment with you

## 2011-01-21 NOTE — Progress Notes (Signed)
PFT done today. 

## 2011-01-22 ENCOUNTER — Encounter: Payer: Self-pay | Admitting: Internal Medicine

## 2011-01-22 NOTE — Assessment & Plan Note (Signed)
He really has minimal copd but asthma component likely based on symbicort/ albuterol dependency  The proper method of use, as well as anticipated side effects, of this metered-dose inhaler are discussed and demonstrated to the patient. 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  Please see instructions for details which were reviewed in writing and the patient given a copy.

## 2011-01-22 NOTE — Assessment & Plan Note (Signed)
Has equipment through advanced doesn't know what doctor prescribed it or what the settings are so needs to regroup with one of our sleep docs next avail appt.

## 2011-01-22 NOTE — Assessment & Plan Note (Signed)
Discussed pft's in context of the effects of morbid obesity on his breathing with the airway issue being a very minor component.

## 2011-01-22 NOTE — Assessment & Plan Note (Signed)
Adequate control on present rx, reviewed   Strongly prefer in this setting: Bystolic, the most beta -1  selective Beta blocker available in sample form, with bisoprolol the most selective generic choice  on the market.  AVOID ACES and COREG here ( already proved intolerant)

## 2011-02-09 ENCOUNTER — Telehealth: Payer: Self-pay | Admitting: Internal Medicine

## 2011-02-09 NOTE — Telephone Encounter (Signed)
Spoke with pt and advised it was probably PT calling to remind him of appt with RA for sleep eval for 02/11/11- Pt verbalized understanding and states needs to cancel this appt due to having another appt that date, and states that he will have tcb to resched. Appt was cancelled.

## 2011-02-11 ENCOUNTER — Institutional Professional Consult (permissible substitution): Payer: Medicare Other | Admitting: Pulmonary Disease

## 2011-03-09 ENCOUNTER — Encounter: Payer: Self-pay | Admitting: Internal Medicine

## 2011-03-16 ENCOUNTER — Ambulatory Visit: Payer: Self-pay | Admitting: Internal Medicine

## 2011-06-01 ENCOUNTER — Other Ambulatory Visit: Payer: Self-pay | Admitting: Orthopedic Surgery

## 2011-06-01 DIAGNOSIS — M545 Low back pain: Secondary | ICD-10-CM

## 2011-06-07 ENCOUNTER — Ambulatory Visit
Admission: RE | Admit: 2011-06-07 | Discharge: 2011-06-07 | Disposition: A | Discharge status: Home / Self Care | Payer: Medicare Other | Source: Ambulatory Visit | Attending: Orthopedic Surgery | Admitting: Orthopedic Surgery

## 2011-06-07 DIAGNOSIS — M545 Low back pain: Secondary | ICD-10-CM

## 2011-10-18 ENCOUNTER — Ambulatory Visit: Payer: Medicare Other | Admitting: Family Medicine

## 2011-10-19 ENCOUNTER — Encounter: Payer: Self-pay | Admitting: Family Medicine

## 2011-10-19 ENCOUNTER — Telehealth: Payer: Self-pay | Admitting: Internal Medicine

## 2011-10-19 ENCOUNTER — Ambulatory Visit (INDEPENDENT_AMBULATORY_CARE_PROVIDER_SITE_OTHER): Payer: Medicare Other | Admitting: Family Medicine

## 2011-10-19 VITALS — BP 130/85 | HR 90 | Temp 98.2°F | Ht 69.5 in | Wt 362.0 lb

## 2011-10-19 DIAGNOSIS — Z794 Long term (current) use of insulin: Secondary | ICD-10-CM

## 2011-10-19 DIAGNOSIS — I10 Essential (primary) hypertension: Secondary | ICD-10-CM

## 2011-10-19 DIAGNOSIS — E349 Endocrine disorder, unspecified: Secondary | ICD-10-CM | POA: Insufficient documentation

## 2011-10-19 DIAGNOSIS — E119 Type 2 diabetes mellitus without complications: Secondary | ICD-10-CM

## 2011-10-19 DIAGNOSIS — E291 Testicular hypofunction: Secondary | ICD-10-CM

## 2011-10-19 DIAGNOSIS — C649 Malignant neoplasm of unspecified kidney, except renal pelvis: Secondary | ICD-10-CM | POA: Insufficient documentation

## 2011-10-19 DIAGNOSIS — J449 Chronic obstructive pulmonary disease, unspecified: Secondary | ICD-10-CM

## 2011-10-19 DIAGNOSIS — Z905 Acquired absence of kidney: Secondary | ICD-10-CM | POA: Insufficient documentation

## 2011-10-19 DIAGNOSIS — R079 Chest pain, unspecified: Secondary | ICD-10-CM | POA: Insufficient documentation

## 2011-10-19 DIAGNOSIS — E785 Hyperlipidemia, unspecified: Secondary | ICD-10-CM

## 2011-10-19 DIAGNOSIS — E1165 Type 2 diabetes mellitus with hyperglycemia: Secondary | ICD-10-CM | POA: Insufficient documentation

## 2011-10-19 DIAGNOSIS — G4700 Insomnia, unspecified: Secondary | ICD-10-CM

## 2011-10-19 MED ORDER — GLUCOSE BLOOD VI STRP: ORAL_STRIP | Status: DC

## 2011-10-19 MED ORDER — "INSULIN SYRINGE 31G X 5/16"" 1 ML MISC": Status: DC

## 2011-10-19 MED ORDER — INSULIN REGULAR HUMAN 100 UNIT/ML IJ SOLN: INTRAMUSCULAR | Status: DC

## 2011-10-19 MED ORDER — INSULIN NPH (HUMAN) (ISOPHANE) 100 UNIT/ML ~~LOC~~ SUSP: 50.0000 [IU] | Freq: Two times a day (BID) | SUBCUTANEOUS | Status: DC

## 2011-10-19 NOTE — Patient Instructions (Signed)
Please follow up in 1 month (30 min) We'll notify you of your lab results and make any changes if needed We'll notify you of your cardiology appt Please try and make healthy food choices [facility] and [facility] Call with any questions or concerns Welcome!

## 2011-10-19 NOTE — Telephone Encounter (Signed)
Refill: Insulin regular (Novolin r) 100 units/ml injection. Inject 30 units qam, inject 40 units with lunch and dinner.  Pharmacy comments: One 10ml bottle last 9 days. May we dispense #30 mls at a time to last a month?  Refill: Glucose blood test strip. Qty 100. Use as instructed.   Pharmacy comments: What type of test strips? Need specific directions on how many times pt tests. Need a dx code for pt's medicare ins. Thanks.

## 2011-10-19 NOTE — Progress Notes (Signed)
  Subjective:    Patient ID: Philip Cain, male    DOB: 1946/06/08, 66 y.o.   MRN: [id number]  HPI New to establish.  Previous MD- Thea Silversmith.  HTN- chronic problem, on Bisoprolol.  Fair control.  + CP, SOB.  Denies HA, visual changes.  Previously on ACE- was stopped due to chronic cough.  Hyperlipidemia- chronic problem, on Zocor.  Denies abd pain, N/V.  DM- chronic problem, insulin dependent.  Also on metformin and glipizide.  Overdue for eye exam.  Checking CBGs 4x/day- 'rarely over 200'.  Denies symptomatic lows.  + CP x2-3 weeks, described as pressure in chest for 30 min.  Typically occuring after eating.  Denies reflux.  Denies SOB.  No longer on ACE/ARB  COPD- chronic problem, sees Dr Sherene Sires.  On Symbicort and albuterol.  Previously a 5 ppd smoker.  Insomnia- chronic problem, no relief w/ Ambien or Lunesta.  Taking Xanax 1-2 tabs nightly.  Low T- chronic problem.  On Androgel.  Due for labs.  Morbid obesity- has gained 20 lbs since Christmas.  [facility] access [facility] for [facility] but not going.  Not watching diet.   Review of Systems For ROS see HPI     Objective:   Physical Exam  Vitals reviewed. Constitutional: He is oriented to person, place, and time. He appears well-developed and well-nourished. No distress.       Morbidly obese  HENT:  Head: Normocephalic and atraumatic.  Eyes: Conjunctivae and EOM are normal. Pupils are equal, round, and reactive to light.  Neck: Normal range of motion. Neck supple. No thyromegaly present.  Cardiovascular: Normal rate, regular rhythm, normal heart sounds and intact distal pulses.   Pulmonary/Chest: Effort normal and breath sounds normal. No respiratory distress. He has no wheezes. He has no rales.  Abdominal: Soft. Bowel sounds are normal. He exhibits no distension. There is no tenderness.  Musculoskeletal: He exhibits edema.  Lymphadenopathy:    He has no cervical adenopathy.  Neurological: He is alert and oriented to person, place,  and time.  Skin: Skin is warm and dry.  Psychiatric: He has a normal mood and affect. His behavior is normal. Thought content normal.          Assessment & Plan:

## 2011-10-20 MED ORDER — GLUCOSE BLOOD VI STRP: ORAL_STRIP | Status: DC

## 2011-10-20 MED ORDER — KETOCONAZOLE 2 % EX CREA: TOPICAL_CREAM | CUTANEOUS | Status: DC

## 2011-10-20 MED ORDER — INSULIN REGULAR HUMAN 100 UNIT/ML IJ SOLN: INTRAMUSCULAR | Status: DC

## 2011-10-20 MED ORDER — ALBUTEROL SULFATE HFA 108 (90 BASE) MCG/ACT IN AERS: 2.0000 | INHALATION_SPRAY | Freq: Four times a day (QID) | RESPIRATORY_TRACT | Status: DC | PRN

## 2011-10-20 MED ORDER — "INSULIN SYRINGE 31G X 5/16"" 1 ML MISC": Status: DC

## 2011-10-20 NOTE — Telephone Encounter (Signed)
Called pharmacy to give verbal instructions on pt medications, spoke to [place] and she advised that the pharmacist is behind, placed me on hold to speak with pharmacy tech joe and advised that the pt wants only the ReliOn insulin advised that our system does not pick the type of insulin, pt needs to discuss with insurance. Gave joe verbal orders that the pt can have of the insulin per only lasts pt nine days. Noted the drum strips sent however pharmacy did not receive the drum order and requested the dx code with how many times the pt tests, and that this had to be prescribed. Sent escribe for drum test strips, with DX code 250.00 advising pt tests 4 times daily.  Called pt to update the concern and medication has been corrected. Pt wife advised that the pt does not want to use the accu-check compact plus that we gave them yesterday per the insurance will not pay for it Noted to send test strips for Accu-check Aviva plus and that pt actually tests his blood sugar 4 to 5 times daily, pt also needs Nizoral cream and pro air inhaler sent to [facility], advised that I would contact Joe at [facility] again to verify the medication supplies,and that all medications would be sent to correct pharmacies, pt wife understood Called joe at [facility] and advised the change in test strips have been sent with correction to CBG check of 4-5 times daily, also sent syringes again per [facility] did not get them,joe understood and stated he received the escribe with the correct instructions and dx code, sent pro air and nizoral cream to [facility] per pt request. Pt aware all medications have been sent

## 2011-10-21 ENCOUNTER — Ambulatory Visit (INDEPENDENT_AMBULATORY_CARE_PROVIDER_SITE_OTHER): Payer: Medicare Other | Admitting: Cardiology

## 2011-10-21 ENCOUNTER — Encounter: Payer: Self-pay | Admitting: Cardiology

## 2011-10-21 DIAGNOSIS — N289 Disorder of kidney and ureter, unspecified: Secondary | ICD-10-CM | POA: Insufficient documentation

## 2011-10-21 DIAGNOSIS — I10 Essential (primary) hypertension: Secondary | ICD-10-CM

## 2011-10-21 DIAGNOSIS — R079 Chest pain, unspecified: Secondary | ICD-10-CM

## 2011-10-21 DIAGNOSIS — E785 Hyperlipidemia, unspecified: Secondary | ICD-10-CM

## 2011-10-21 NOTE — Assessment & Plan Note (Signed)
Given his diabetes mellitus I would favor enteric-coated aspirin 81 mg daily. He would also benefit from a statin long-term. He would like to discuss this with his nephrologist and primary care. I will therefore leave this issue to them.

## 2011-10-21 NOTE — Assessment & Plan Note (Signed)
Long discussion concerning risks related to this issue. I consider this life-threatening. If his functional study is normal or low risk I have recommended exercise and diet.

## 2011-10-21 NOTE — Patient Instructions (Signed)
Your physician recommends that you schedule a follow-up appointment in: AS NEEDED PENDING TEST RESULTS  Your physician has requested that you have a lexiscan myoview. For further information please visit [email or web address] Please follow instruction sheet, as given.AT [place]-2 DAY PROTOCAL   Your physician has requested that you have an echocardiogram. Echocardiography is a painless test that uses sound waves to create images of your heart. It provides your doctor with information about the size and shape of your heart and how well your heart's chambers and valves are working. This procedure takes approximately one hour. There are no restrictions for this procedure.

## 2011-10-21 NOTE — Assessment & Plan Note (Signed)
Managed by nephrology. 

## 2011-10-21 NOTE — Progress Notes (Signed)
HPI: 66 year old male with no prior cardiac history for evaluation of chest pain. Patient states several years ago he was felt to potentially have congestive heart failure. However apparently he had acute renal insufficiency at the time. He had an echo and a stress test in [facility] which apparently were unremarkable. No records available. He has chronic dyspnea on exertion but no orthopnea, PND or pedal edema. In the past 3 weeks he is noticed occasional chest pain. It is in the left chest area. It does not radiate. It is described as an ache. No associated symptoms. Not exertional or pleuritic. It can occur after eating. He also has pain with coughing. Because of the above we were asked to further evaluate.  Current Outpatient Prescriptions  Medication Sig Dispense Refill  . albuterol (PROAIR HFA) 108 (90 BASE) MCG/ACT inhaler Inhale 2 puffs into the lungs every 6 (six) hours as needed for wheezing.  18 g  5  . allopurinol (ZYLOPRIM) 100 MG tablet Take 1 tablet by mouth daily.      Marland Kitchen ALPRAZolam (XANAX) 1 MG tablet Take 1 mg by mouth as needed.       . bisoprolol (ZEBETA) 10 MG tablet Take 1 tablet by mouth Twice daily.      . budesonide-formoterol (SYMBICORT) 160-4.5 MCG/ACT inhaler Inhale 2 puffs into the lungs 2 (two) times daily.  1 Inhaler  12  . Cholecalciferol (VITAMIN D) 1000 UNITS capsule Take 2,000 Units by mouth daily.       Marland Kitchen COLCRYS 0.6 MG tablet Take 1 tablet by mouth as needed.       Marland Kitchen glipiZIDE (GLUCOTROL) 10 MG tablet Take 10 mg by mouth 2 (two) times daily before a meal.       . glucose blood test strip Use as instructed  100 each  12  . HYDROcodone-acetaminophen (NORCO) 5-325 MG per tablet Take 1 tablet by mouth Every 6 hours as needed.      . Insulin Isophane Human (HUMULIN N [place]) Inject 50 Units into the skin 2 (two) times daily.        . insulin NPH (HUMULIN N,NOVOLIN N) 100 UNIT/ML injection Inject 50 Units into the skin. SLIDING SCLAE      . insulin regular (NOVOLIN  R,HUMULIN R) 100 units/mL injection SLIDING SCALE      . Insulin Regular Human (HUMULIN R IJ) 30 units every am, 40 units with lunch and with dinner       . Insulin Syringe-Needle U-100 (INSULIN SYRINGE 1CC/31GX5/16") 31G X 5/16" 1 ML MISC USE AS DIRECTED  100 each  6  . ketoconazole (NIZORAL) 2 % cream USE AS DIRECTED  15 g  1  . metFORMIN (GLUCOPHAGE) 500 MG tablet Take 500 mg by mouth 2 (two) times daily with a meal.      . Tamsulosin HCl (FLOMAX) 0.4 MG CAPS Take 1 tablet by mouth daily.      Marland Kitchen torsemide (DEMADEX) 20 MG tablet Take 20 mg by mouth daily.          No Known Allergies  Past Medical History  Diagnosis Date  . Hypertension   . Hyperlipidemia   . History of kidney cancer   . Diabetes mellitus   . OSA (obstructive sleep apnea)   . Kidney failure   . COPD (chronic obstructive pulmonary disease)   . Gout   . Arthritis     Past Surgical History  Procedure Date  . Nephrectomy 1/10    right  . Shoulder  surgery 7/05    right  . Revision total hip arthroplasty     bilaterally  . Total knee arthroplasty 2007    left  . Tonsillectomy     History   Social History  . Marital Status: Married    Spouse Name: N/A    Number of Children: 2  . Years of Education: N/A   Occupational History  . Retired     Naval architect   Social History Main Topics  . Smoking status: Former Smoker -- 4.0 packs/day for 30 years    Types: Cigarettes    Quit date: 08/23/1994  . Smokeless tobacco: Never Used  . Alcohol Use: No  . Drug Use: No  . Sexually Active: Not on file   Other Topics Concern  . Not on file   Social History Narrative  . No narrative on file    Family History  Problem Relation Age of Onset  . Heart disease Mother     Died of CHF  . Stroke Mother   . Hypertension Mother   . Stroke Father     ROS: Significant back pain and arthralgias but no fevers or chills, productive cough, hemoptysis, dysphasia, odynophagia, melena, hematochezia, dysuria, hematuria,  rash, seizure activity, orthopnea, PND, pedal edema, claudication. Remaining systems are negative.  Physical Exam:  Blood pressure 149/86, pulse 90, height 6' (1.829 m), weight 372 lb (168.738 kg).  General:  Well developed/morbidly obese in NAD Skin warm/dry Patient not depressed No peripheral clubbing Back-normal HEENT-normal/normal eyelids Neck supple/normal carotid upstroke bilaterally; no bruits; no JVD; no thyromegaly chest - CTA/ normal expansion CV - RRR/normal S1 and S2; no murmurs, rubs or gallops;  PMI nondisplaced Abdomen -difficult due to patient's obesity. No masses palpated. No bruit. femoral pulses not palpated Ext-no edema, chords; distal pulses diminished. Neuro-grossly nonfocal  ECG 10/19/2011-sinus rhythm at a rate of 75. 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

## 2011-10-21 NOTE — Assessment & Plan Note (Signed)
Symptoms atypical. However multiple cardiac risk factors. Issue is complicated by his size and renal insufficiency. He cannot exercise. Echo images most likely poor given his size and history of COPD. Plan Lexiscan Myoview. If it is normal or low risk will most likely treat medically. He would be at significant risk for contrast nephropathy given previous nephrectomy, baseline renal insufficiency and diabetes mellitus. If it is high-risk we will discuss catheterization and potential options. He would not be a candidate for bypass surgery given his size. Check echocardiogram for LV function.

## 2011-10-21 NOTE — Progress Notes (Signed)
Addended by: Freddi Starr on: 10/21/2011 01:57 PM   Modules accepted: Orders

## 2011-10-21 NOTE — Assessment & Plan Note (Signed)
Blood pressure is elevated. He will follow this and his medications can be increased as needed. I will leave this to primary care.

## 2011-10-22 ENCOUNTER — Telehealth: Payer: Self-pay

## 2011-10-22 NOTE — Telephone Encounter (Signed)
Patient calling regarding his lab results from blood work he had drawn on 10/19/11.  He would like the results called to him and a copy of the results mailed as well.  Please call patient.

## 2011-10-22 NOTE — Telephone Encounter (Signed)
Called pt to advise that his results are not back from the lab at this time, we will call him as soon as they come in. Pt understood

## 2011-10-25 ENCOUNTER — Encounter: Payer: Self-pay | Admitting: Family Medicine

## 2011-10-25 ENCOUNTER — Ambulatory Visit (INDEPENDENT_AMBULATORY_CARE_PROVIDER_SITE_OTHER): Payer: Medicare Other | Admitting: Family Medicine

## 2011-10-25 VITALS — BP 127/82 | HR 99 | Temp 98.1°F | Ht 71.0 in | Wt 353.8 lb

## 2011-10-25 DIAGNOSIS — J441 Chronic obstructive pulmonary disease with (acute) exacerbation: Secondary | ICD-10-CM

## 2011-10-25 DIAGNOSIS — E039 Hypothyroidism, unspecified: Secondary | ICD-10-CM | POA: Insufficient documentation

## 2011-10-25 DIAGNOSIS — E119 Type 2 diabetes mellitus without complications: Secondary | ICD-10-CM

## 2011-10-25 MED ORDER — KETOCONAZOLE 2 % EX CREA: TOPICAL_CREAM | CUTANEOUS | Status: DC

## 2011-10-25 MED ORDER — SIMVASTATIN 40 MG PO TABS: 40.0000 mg | ORAL_TABLET | Freq: Every day | ORAL | Status: DC

## 2011-10-25 MED ORDER — GUAIFENESIN-CODEINE 100-10 MG/5ML PO SYRP: 10.0000 mL | ORAL_SOLUTION | Freq: Three times a day (TID) | ORAL | Status: AC | PRN

## 2011-10-25 MED ORDER — AMOXICILLIN-POT CLAVULANATE 875-125 MG PO TABS: 1.0000 | ORAL_TABLET | Freq: Two times a day (BID) | ORAL | Status: DC

## 2011-10-25 MED ORDER — LEVOTHYROXINE SODIUM 50 MCG PO TABS: 50.0000 ug | ORAL_TABLET | Freq: Every day | ORAL | Status: DC

## 2011-10-25 NOTE — Progress Notes (Signed)
  Subjective:    Patient ID: Philip Cain, male    DOB: 1946-01-25, 66 y.o.   MRN: [id number]  HPI cough- has chronic cough but worsened on Friday.  Hx of COPD.  Cough is productive.  No fevers.  + chills.  + HAs.  Denies ear pain.  No known sick contacts.  + nasal congestion.  DM- chronic problem.  A1C of 9.3 shows poor control.  Hypothyroid- pt's TSH is elevated.  Has never been on thyroid meds.     Review of Systems For ROS see HPI     Objective:   Physical Exam  Vitals reviewed. Constitutional: He appears well-developed and well-nourished. No distress.  HENT:  Head: Normocephalic and atraumatic.  Right Ear: Tympanic membrane normal.  Left Ear: Tympanic membrane normal.  Nose: No mucosal edema or rhinorrhea. Right sinus exhibits no maxillary sinus tenderness and no frontal sinus tenderness. Left sinus exhibits no maxillary sinus tenderness and no frontal sinus tenderness.  Mouth/Throat: Mucous membranes are normal. No oropharyngeal exudate, posterior oropharyngeal edema or posterior oropharyngeal erythema.  Eyes: Conjunctivae and EOM are normal. Pupils are equal, round, and reactive to light.  Neck: Normal range of motion. Neck supple. No thyromegaly (very thick neck, difficult to appreciate) present.  Cardiovascular: Normal rate, regular rhythm and normal heart sounds.   Pulmonary/Chest: Effort normal. No respiratory distress. He has wheezes. He has rales.       + hacking cough  Lymphadenopathy:    He has no cervical adenopathy.  Skin: Skin is warm and dry.          Assessment & Plan:

## 2011-10-25 NOTE — Patient Instructions (Signed)
We'll call you with your endocrinology appt Start the levothyroxine daily for your low thyroid Start the Augmentin twice daily for the bronchitis- take w/ food to avoid upset stomach Use the inhaler as needed for wheezing Use the cough med as needed Call with any questions or concerns Hang in there!

## 2011-10-26 ENCOUNTER — Telehealth: Payer: Self-pay | Admitting: *Deleted

## 2011-10-26 NOTE — Telephone Encounter (Signed)
Called pt to advise his labs came in from [facility] noting PSA is good and Testosterone is on the low end of normal in normal,per MD Tabori verbal instructions, per pt signed waiver to leave detailed message, advised if pt has any further questions to call office however today is MD Tabori half day and she will return 10-27-11

## 2011-10-28 ENCOUNTER — Other Ambulatory Visit (HOSPITAL_COMMUNITY): Payer: Medicare Other

## 2011-10-29 ENCOUNTER — Other Ambulatory Visit (HOSPITAL_COMMUNITY): Payer: Medicare Other

## 2011-11-03 ENCOUNTER — Other Ambulatory Visit: Payer: Self-pay

## 2011-11-03 ENCOUNTER — Ambulatory Visit (HOSPITAL_COMMUNITY): Payer: Medicare Other | Attending: Cardiology

## 2011-11-03 DIAGNOSIS — I10 Essential (primary) hypertension: Secondary | ICD-10-CM | POA: Insufficient documentation

## 2011-11-03 DIAGNOSIS — J4489 Other specified chronic obstructive pulmonary disease: Secondary | ICD-10-CM | POA: Insufficient documentation

## 2011-11-03 DIAGNOSIS — J449 Chronic obstructive pulmonary disease, unspecified: Secondary | ICD-10-CM | POA: Insufficient documentation

## 2011-11-03 DIAGNOSIS — E119 Type 2 diabetes mellitus without complications: Secondary | ICD-10-CM | POA: Insufficient documentation

## 2011-11-03 DIAGNOSIS — R072 Precordial pain: Secondary | ICD-10-CM | POA: Insufficient documentation

## 2011-11-03 DIAGNOSIS — E785 Hyperlipidemia, unspecified: Secondary | ICD-10-CM | POA: Insufficient documentation

## 2011-11-03 DIAGNOSIS — I059 Rheumatic mitral valve disease, unspecified: Secondary | ICD-10-CM | POA: Insufficient documentation

## 2011-11-03 NOTE — Assessment & Plan Note (Signed)
New to provider.  Following w/ pulm.  Will follow and assist as able.

## 2011-11-03 NOTE — Assessment & Plan Note (Signed)
New.  Pt's goal is <70 due to DM.  Currently on Zocor- tolerating w/out difficulty.  Check labs.  Adjust meds prn.

## 2011-11-03 NOTE — Assessment & Plan Note (Signed)
New to provider- chronic for pt.  Adequate BP control, having atypical CP- refer to cards.

## 2011-11-03 NOTE — Assessment & Plan Note (Signed)
New to provider.  Was intolerant to prescription sleep aides previously.  Now taking xanax nightly.

## 2011-11-03 NOTE — Assessment & Plan Note (Signed)
New.  Pt w/ multiple cardiac risk factors- morbid obesity, DM, HTN, hyperlipidemia, tobacco use.  Refer to cards for complete evaluation.

## 2011-11-03 NOTE — Assessment & Plan Note (Signed)
New to provider.  Pt reports this is chronic problem.  On testosterone replacement.  Due to renal cancer and nephrectomy should be following w/ urology.

## 2011-11-03 NOTE — Assessment & Plan Note (Signed)
New to provider.  Pt has previously seen endo but did not get along w/ multiple providers.  Pt is not following ADA diet, exercising- is morbidly obese.  Check labs.  Adjust meds prn.  Stressed importance of lifestyle changes.  Will follow closely.

## 2011-11-03 NOTE — Assessment & Plan Note (Signed)
New to provider.  Discussed w/ pt that this is life threatening and severely complicating his multiple comorbid medical issues.  Stressed importance of healthy diet, regular exercise and portion control.  Will follow.

## 2011-11-03 NOTE — Assessment & Plan Note (Signed)
New to provider.  S/p nephrectomy.  Following w/ renal.

## 2011-11-04 NOTE — Assessment & Plan Note (Signed)
New.  Start abx.  Reviewed proper use of inhaler.  Reviewed supportive care and red flags that should prompt return.  Pt expressed understanding and is in agreement w/ plan.

## 2011-11-04 NOTE — Assessment & Plan Note (Signed)
New.  Discovered on lab results obtained at last visit.  Start synthroid.  Await endo referral.  Pt expressed understanding and is in agreement w/ plan.

## 2011-11-04 NOTE — Assessment & Plan Note (Signed)
Very poor control.  Stressed importance of healthy diet and regular exercise.  Based on lab values, will need endo referral.  Pt is very particular about this- has had encounters w/ multiple providers in the past and has been unhappy.  Will attempt to get pt seen by Dr Talmage Nap.

## 2011-11-05 ENCOUNTER — Telehealth: Payer: Self-pay | Admitting: Cardiology

## 2011-11-05 NOTE — Telephone Encounter (Signed)
Fu call °Patient returning your call °

## 2011-11-08 NOTE — Telephone Encounter (Signed)
Spoke with pt, aware of echo results. 

## 2011-11-09 ENCOUNTER — Telehealth: Payer: Self-pay | Admitting: Family Medicine

## 2011-11-09 MED ORDER — GLUCOSE BLOOD VI STRP: ORAL_STRIP | Status: DC

## 2011-11-09 NOTE — Telephone Encounter (Signed)
Patient is picking up a new meter & would like a prescription called into [facility] for Strips for a Nano 150/strips Please

## 2011-11-09 NOTE — Telephone Encounter (Signed)
Faxed.   KP 

## 2011-11-22 ENCOUNTER — Encounter: Payer: Self-pay | Admitting: Family Medicine

## 2011-11-22 ENCOUNTER — Ambulatory Visit (INDEPENDENT_AMBULATORY_CARE_PROVIDER_SITE_OTHER): Payer: Medicare Other | Admitting: Family Medicine

## 2011-11-22 VITALS — BP 125/80 | HR 74 | Temp 98.2°F | Ht 72.0 in | Wt 342.4 lb

## 2011-11-22 DIAGNOSIS — E039 Hypothyroidism, unspecified: Secondary | ICD-10-CM

## 2011-11-22 DIAGNOSIS — E119 Type 2 diabetes mellitus without complications: Secondary | ICD-10-CM

## 2011-11-22 MED ORDER — GLUCOSE BLOOD VI STRP: ORAL_STRIP | Status: AC

## 2011-11-22 NOTE — Patient Instructions (Signed)
Follow up in 2 months to recheck your thyroid medicine/level Have Dr Lucianne Muss send me his notes Congrats on the 10 lbs!!  You can do this! Have the pharmacy contact me when you need your xanax refill Call with any questions or concerns [place]!!!

## 2011-11-22 NOTE — Progress Notes (Signed)
  Subjective:    Patient ID: Philip Cain, male    DOB: Apr 19, 1946, 66 y.o.   MRN: [id number]  HPI DM- has appt upcoming w/ Dr Lucianne Muss [facility]) in 2 weeks.  Has lost 10 lbs since last visit.  Admits he is still not following ADA diet but 'i'm trying to do better'.  Denies symptomatic lows, CP, SOB above baseline, HAs, visual changes, nausea, vomiting.  Obesity- has lost 10 lbs since last visit.  Is not exercising but is trying to watch his diet.  Plans to resume pool exercise.  Has also started on Synthroid.  Hypothyroid- pt start on levothyroxine 50 mcg at last visit.  No side effects.  Tolerating w/out difficulty.   Review of Systems For ROS see HPI     Objective:   Physical Exam  Vitals reviewed. Constitutional: He is oriented to person, place, and time. He appears well-developed and well-nourished. No distress.       Morbidly obese  HENT:  Head: Normocephalic and atraumatic.  Eyes: Conjunctivae and EOM are normal. Pupils are equal, round, and reactive to light.  Neck: Normal range of motion. Neck supple. No thyromegaly present.  Cardiovascular: Normal rate, regular rhythm, normal heart sounds and intact distal pulses.   No murmur heard. Pulmonary/Chest: Effort normal and breath sounds normal. No respiratory distress.  Abdominal: Soft. Bowel sounds are normal. He exhibits no distension.  Musculoskeletal: He exhibits no edema.  Lymphadenopathy:    He has no cervical adenopathy.  Neurological: He is alert and oriented to person, place, and time. No cranial nerve deficit.  Skin: Skin is warm and dry.  Psychiatric: He has a normal mood and affect. His behavior is normal.          Assessment & Plan:

## 2011-11-23 ENCOUNTER — Telehealth: Payer: Self-pay | Admitting: Family Medicine

## 2011-11-23 NOTE — Telephone Encounter (Signed)
Noted! Thank you

## 2011-11-23 NOTE — Telephone Encounter (Signed)
Dx: 250.02, test 5x/day as directed

## 2011-11-23 NOTE — Telephone Encounter (Signed)
Please advise per not sure if code changed due to recent lab results

## 2011-11-23 NOTE — Assessment & Plan Note (Signed)
New.  Pt tolerating meds w/out difficulty.  Has lost 10 lbs since starting medication.  Will recheck labs in 2 months (3 months since starting meds)

## 2011-11-23 NOTE — Telephone Encounter (Signed)
[facility] pharmacy states they need dx codes and directions for:  Glucose blood test strip

## 2011-11-23 NOTE — Assessment & Plan Note (Signed)
Pt has lost 10 lbs since last visit and plans to resume pool exercise.  'i heard you, doc'.  Wants to make a commitment to improve his lifestyle and his health.  Will continue to follow closely.  Applauded recent efforts.

## 2011-11-23 NOTE — Telephone Encounter (Signed)
FYI :Called pharmacy to advise the dx code for pt to be 250.02 test 5 times daily, spoke to rep and was advised that the information has to faxed in and not called in, contacted scheduler to advise the protocol for the dx codes, located form, form filled out and faxed back to pharmacy per medicare rules for dx codes.

## 2011-11-23 NOTE — Assessment & Plan Note (Signed)
Pt has appt upcoming w/ Endo.  Needs script for strips until he sees them.  Applauded recent weight loss and commitment to 'doing better'.  Will continue to follow closely and assist as able.

## 2011-11-29 IMAGING — CR DG HIP (WITH OR WITHOUT PELVIS) 2-3V*L*
3 series · 3 of 3 positions shown · non-contrast
Comparison: None.

CLINICAL DATA: Evaluation of the left hip arthroplasty.

LEFT HIP - COMPLETE 2+ VIEW

[view not recorded (1 of 3)]
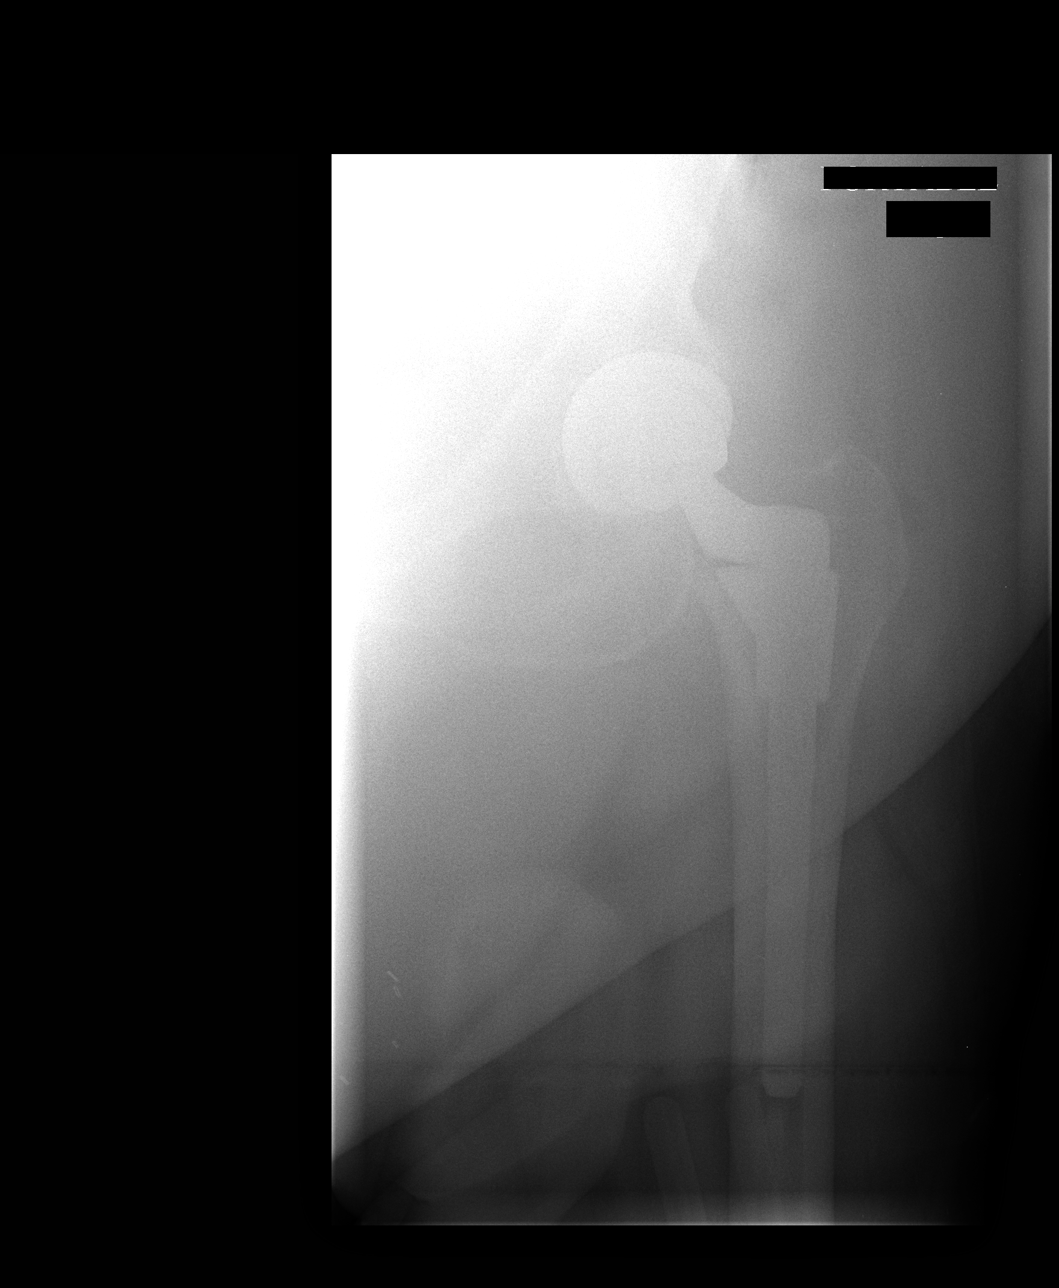

[view not recorded (2 of 3)]
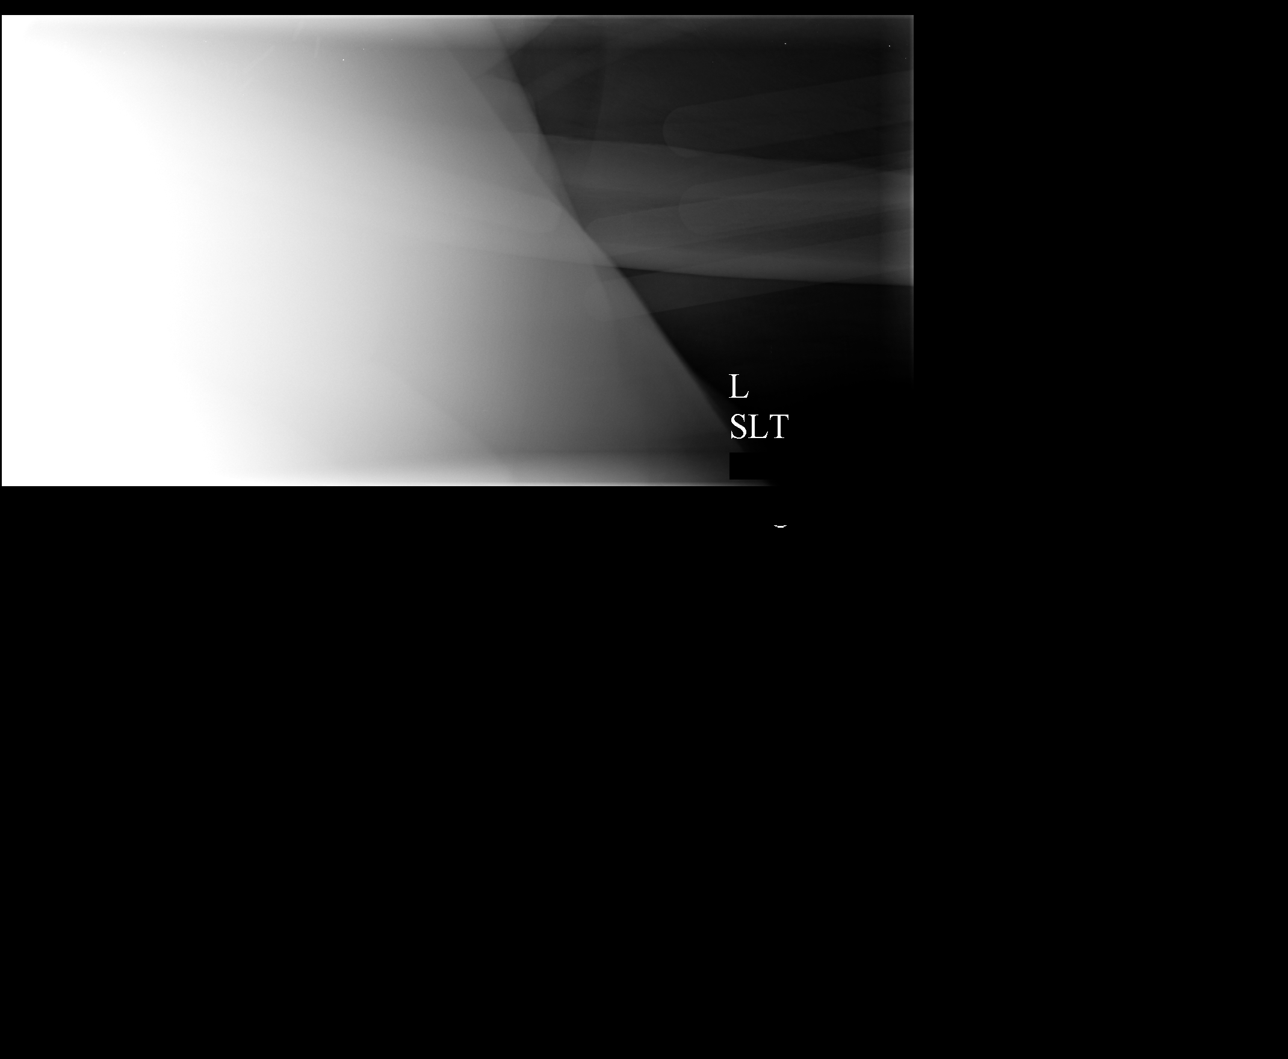

[view not recorded (3 of 3)]
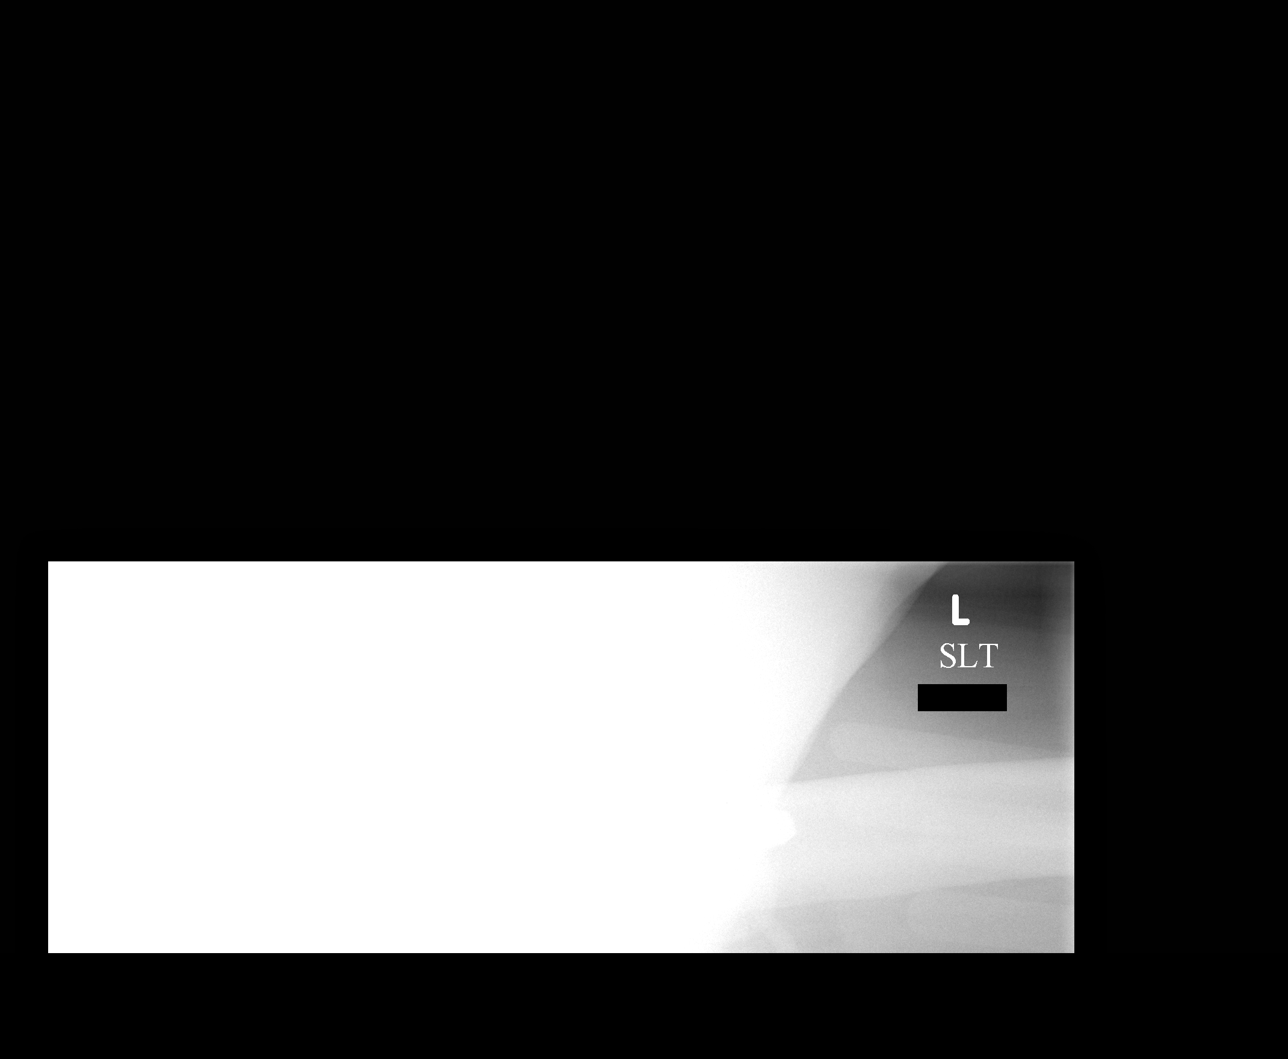

[3 of 3 positions shown; findings below may reference images not displayed]

FINDINGS: Left hip arthroplasty has been performed.  There is
expected relationship between the acetabulum and acetabular
component as well as between the acetabular and femoral components
of the arm through a plaster hardware.  Patient's obesity
compromises detail particularly on the lateral images which were
suboptimal.
IMPRESSION: Left hip arthroplasty has been performed.  No disruption of the
hardware or dislocation is seen.

## 2011-11-29 IMAGING — CR DG PORTABLE PELVIS
1 series · 1 of 1 positions shown · non-contrast
Comparison: None.

CLINICAL DATA: History of degenerative joint disease left hip.
History of post left total hip arthroplasty.  Obesity.

PORTABLE PELVIS

[view not recorded]
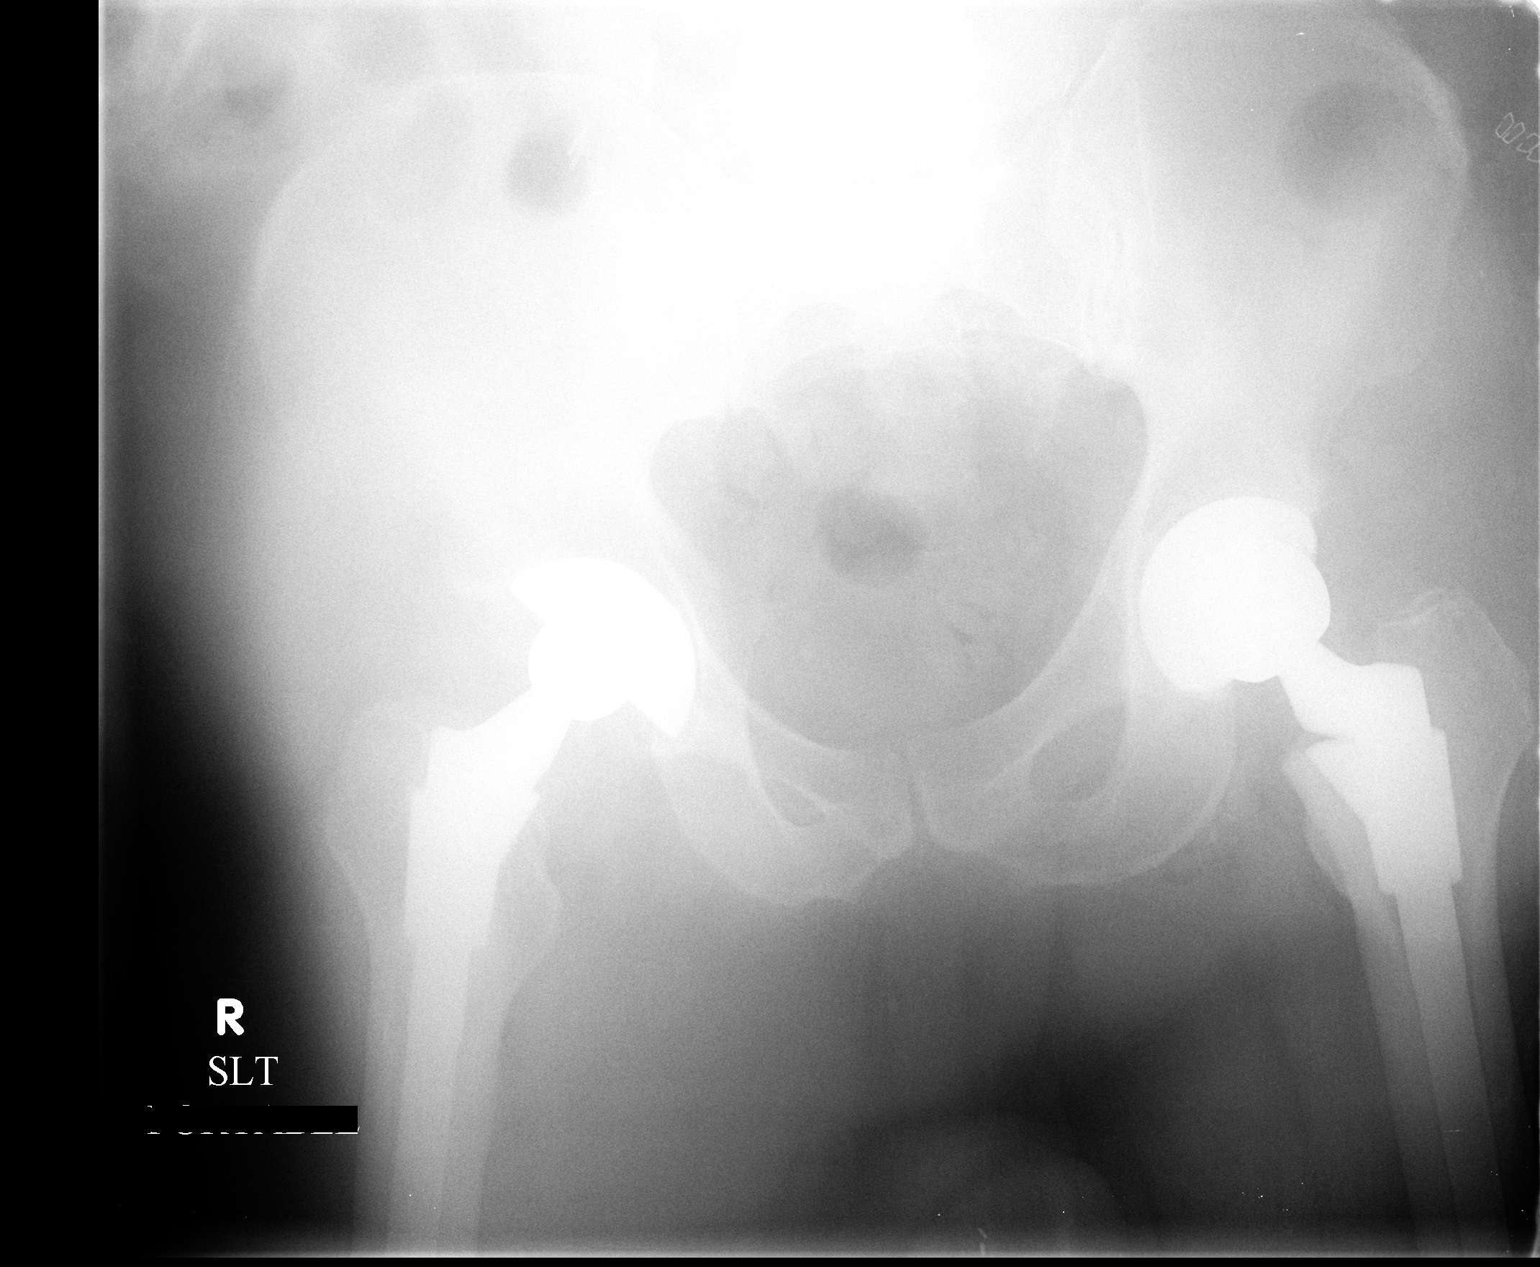

[1 of 1 positions shown; findings below may reference images not displayed]

FINDINGS: History of left total hip arthroplasty.  The patient has
also undergone previous right hip arthroplasty.

There is expected relationship on the AP image between the
acetabular component and the acetabular and between the acetabular
and femoral components of the arthroplasty.  No dislocation is
evident.
IMPRESSION: Left hip arthroplasty has been performed.  No disruption or
dislocation is seen.

## 2011-12-13 ENCOUNTER — Telehealth: Payer: Self-pay | Admitting: Family Medicine

## 2011-12-13 MED ORDER — ALPRAZOLAM 1 MG PO TABS: 1.0000 mg | ORAL_TABLET | Freq: Two times a day (BID) | ORAL | Status: DC | PRN

## 2011-12-13 NOTE — Telephone Encounter (Signed)
.  rx faxed to pharmacy, manually.  

## 2011-12-13 NOTE — Telephone Encounter (Signed)
Pt called back and spoke to scheduler to advise that he needs to make sure MD Beverely Low know how he takes his Xanax 1mg , noted that he takes them BID and needs #180, pt noted new to establish and noted as taking once daily, please advise

## 2011-12-13 NOTE — Telephone Encounter (Signed)
Ok for #180, no refills 

## 2011-12-13 NOTE — Telephone Encounter (Signed)
Refill: Alprazolam 1mg  tab. Last fill 08-25-11

## 2012-01-24 ENCOUNTER — Ambulatory Visit (INDEPENDENT_AMBULATORY_CARE_PROVIDER_SITE_OTHER): Payer: Medicare Other | Admitting: Family Medicine

## 2012-01-24 ENCOUNTER — Encounter: Payer: Self-pay | Admitting: Family Medicine

## 2012-01-24 VITALS — BP 132/85 | HR 95 | Temp 98.5°F | Ht 72.0 in | Wt 353.4 lb

## 2012-01-24 DIAGNOSIS — R5383 Other fatigue: Secondary | ICD-10-CM | POA: Insufficient documentation

## 2012-01-24 DIAGNOSIS — Z79899 Other long term (current) drug therapy: Secondary | ICD-10-CM

## 2012-01-24 DIAGNOSIS — R5381 Other malaise: Secondary | ICD-10-CM

## 2012-01-24 DIAGNOSIS — E039 Hypothyroidism, unspecified: Secondary | ICD-10-CM

## 2012-01-24 LAB — VITAMIN B12: Vitamin B-12: 430 pg/mL (ref 211–911)

## 2012-01-24 MED ORDER — ALLOPURINOL 100 MG PO TABS: 100.0000 mg | ORAL_TABLET | Freq: Every day | ORAL | Status: DC

## 2012-01-24 MED ORDER — BISOPROLOL FUMARATE 10 MG PO TABS: ORAL_TABLET | ORAL | Status: DC

## 2012-01-24 MED ORDER — LEVOTHYROXINE SODIUM 50 MCG PO TABS: 50.0000 ug | ORAL_TABLET | Freq: Every day | ORAL | Status: DC

## 2012-01-24 MED ORDER — TAMSULOSIN HCL 0.4 MG PO CAPS: 0.4000 mg | ORAL_CAPSULE | Freq: Every day | ORAL | Status: DC

## 2012-01-24 MED ORDER — PHENTERMINE HCL 37.5 MG PO CAPS: 37.5000 mg | ORAL_CAPSULE | ORAL | Status: DC

## 2012-01-24 MED ORDER — KETOCONAZOLE 2 % EX CREA: TOPICAL_CREAM | CUTANEOUS | Status: DC

## 2012-01-24 MED ORDER — SIMVASTATIN 40 MG PO TABS: 40.0000 mg | ORAL_TABLET | Freq: Every day | ORAL | Status: DC

## 2012-01-24 MED ORDER — BISOPROLOL FUMARATE 10 MG PO TABS: 10.0000 mg | ORAL_TABLET | Freq: Every day | ORAL | Status: DC

## 2012-01-24 MED ORDER — TORSEMIDE 20 MG PO TABS: 20.0000 mg | ORAL_TABLET | Freq: Every day | ORAL | Status: DC

## 2012-01-24 NOTE — Progress Notes (Signed)
  Subjective:    Patient ID: Philip Cain, male    DOB: 02/02/1946, 66 y.o.   MRN: [id number]  HPI Hypothyroid- pt was started on meds 3 months ago.  Due for labs- has labs scheduled tomorrow for Dr Lucianne Muss.  + fatigue- is wondering about B12 deficiency.  Had lost weight at last visit but has regained weight.  Morbid obesity- pt is asking for help w/ weight loss.  Daughter has taken Phentermine w/ good results.  Would like this if possible.  Knows that this is not a long term solution but 'i just need something to get me started'.  [facility] [employer] [facility] [employer].  Plans on starting to exercise.  Attempting to make dietary changes.   Review of Systems For ROS see HPI     Objective:   Physical Exam  Vitals reviewed. Constitutional: He is oriented to person, place, and time. He appears well-developed and well-nourished. No distress.       Morbidly obese  HENT:  Head: Normocephalic and atraumatic.  Eyes: Conjunctivae and EOM are normal. Pupils are equal, round, and reactive to light.  Neck: Normal range of motion. Neck supple. No thyromegaly present.  Cardiovascular: Normal rate, regular rhythm, normal heart sounds and intact distal pulses.   No murmur heard. Pulmonary/Chest: Effort normal and breath sounds normal. No respiratory distress.  Abdominal: Soft. Bowel sounds are normal. He exhibits no distension.  Musculoskeletal: He exhibits no edema.  Lymphadenopathy:    He has no cervical adenopathy.  Neurological: He is alert and oriented to person, place, and time. No cranial nerve deficit.  Skin: Skin is warm and dry.  Psychiatric: He has a normal mood and affect. His behavior is normal.          Assessment & Plan:

## 2012-01-24 NOTE — Patient Instructions (Signed)
Follow up in 3 months to recheck weight loss progress Continue to exercise regularly and make healthy food choices Start the Phentermine daily We'll notify you of your lab results Call with any questions or concerns Hang in there!

## 2012-01-25 ENCOUNTER — Encounter: Payer: Self-pay | Admitting: *Deleted

## 2012-02-01 NOTE — Assessment & Plan Note (Signed)
Pt aware that this is biggest health problem for him right now.  Has joined [employer] and is trying to make better food choices.  Will start Phentermine to try and jump start weight loss.  Reviewed possible side effects- palpitations, HTN, anxiety, insomnia- pt to call if any occur.

## 2012-02-01 NOTE — Assessment & Plan Note (Signed)
New.  Pt recently started on thyroid meds- due to have labs rechecked.  Also r/o B12 deficiency.  Discussed that this may be due to his size and physical deconditioning.  Will follow.

## 2012-02-01 NOTE — Assessment & Plan Note (Signed)
New dx for pt.  Started on meds 3 months ago.  Due to have labs rechecked.  Adjust meds prn.

## 2012-02-29 ENCOUNTER — Other Ambulatory Visit: Payer: Self-pay | Admitting: *Deleted

## 2012-02-29 DIAGNOSIS — N50819 Testicular pain, unspecified: Secondary | ICD-10-CM

## 2012-02-29 NOTE — Progress Notes (Signed)
Imaging order placed per [facility] to be able to complete original order; MD informed to sign off on in EMR/SLS

## 2012-02-29 NOTE — Progress Notes (Signed)
Order Created in Error/SLS

## 2012-03-06 ENCOUNTER — Telehealth: Payer: Self-pay | Admitting: Family Medicine

## 2012-03-06 NOTE — Telephone Encounter (Signed)
needs samples of symbicort 187m 4.5 as, pt., also noted he has switched from dr. Lucianne Muss to dr Everardo All Can call back at [phone]

## 2012-03-07 NOTE — Telephone Encounter (Signed)
Pt called again requesting Symbicort samples.

## 2012-03-07 NOTE — Telephone Encounter (Signed)
Called pt to advise sample has been placed up front desk. Pt understood

## 2012-03-22 ENCOUNTER — Ambulatory Visit: Payer: Medicare Other | Admitting: Internal Medicine

## 2012-03-23 ENCOUNTER — Telehealth: Payer: Self-pay | Admitting: Family Medicine

## 2012-03-23 ENCOUNTER — Ambulatory Visit: Payer: Medicare Other | Admitting: Endocrinology

## 2012-03-23 ENCOUNTER — Ambulatory Visit: Payer: Medicare Other | Admitting: Internal Medicine

## 2012-03-23 MED ORDER — ALPRAZOLAM 1 MG PO TABS: 1.0000 mg | ORAL_TABLET | Freq: Two times a day (BID) | ORAL | Status: DC | PRN

## 2012-03-23 NOTE — Telephone Encounter (Signed)
Last OV 01-24-12 Follow up apt, last refill 12-13-11 #180 with 0 refills BID/PRN in absence of MD Beverely Low

## 2012-03-23 NOTE — Telephone Encounter (Signed)
.  rx faxed to pharmacy, manually for #60 no refills

## 2012-03-23 NOTE — Addendum Note (Signed)
Addended by: Derry Lory A on: 03/23/2012 04:42 PM   Modules accepted: Orders

## 2012-03-23 NOTE — Telephone Encounter (Signed)
Refill: alprazolam 1mg  tab. Last fill 12-13-11

## 2012-03-23 NOTE — Telephone Encounter (Signed)
#  60 , 1 bid prn . Additional refills can be provided by Dr. Karie Schwalbe. upon her return

## 2012-03-28 ENCOUNTER — Ambulatory Visit (INDEPENDENT_AMBULATORY_CARE_PROVIDER_SITE_OTHER): Payer: Medicare Other | Admitting: Family Medicine

## 2012-03-28 ENCOUNTER — Encounter: Payer: Self-pay | Admitting: Family Medicine

## 2012-03-28 ENCOUNTER — Telehealth: Payer: Self-pay | Admitting: *Deleted

## 2012-03-28 VITALS — BP 124/82 | HR 99 | Temp 98.1°F | Ht 72.25 in | Wt 345.8 lb

## 2012-03-28 DIAGNOSIS — F32A Depression, unspecified: Secondary | ICD-10-CM | POA: Insufficient documentation

## 2012-03-28 DIAGNOSIS — E1165 Type 2 diabetes mellitus with hyperglycemia: Secondary | ICD-10-CM

## 2012-03-28 DIAGNOSIS — F411 Generalized anxiety disorder: Secondary | ICD-10-CM

## 2012-03-28 DIAGNOSIS — F329 Major depressive disorder, single episode, unspecified: Secondary | ICD-10-CM | POA: Insufficient documentation

## 2012-03-28 DIAGNOSIS — F419 Anxiety disorder, unspecified: Secondary | ICD-10-CM

## 2012-03-28 DIAGNOSIS — J3489 Other specified disorders of nose and nasal sinuses: Secondary | ICD-10-CM

## 2012-03-28 MED ORDER — FLUCONAZOLE 200 MG PO TABS: 200.0000 mg | ORAL_TABLET | Freq: Every day | ORAL | Status: AC

## 2012-03-28 MED ORDER — AMOXICILLIN-POT CLAVULANATE 875-125 MG PO TABS: 1.0000 | ORAL_TABLET | Freq: Two times a day (BID) | ORAL | Status: AC

## 2012-03-28 NOTE — Telephone Encounter (Signed)
Called pharmacy rep Arlys John to advise the cancellation for pt recent refill on 03-23-12 for #60 xanax, per Arlys John noted this refill HAS NOT been picked up, pt wanted to receive his normal amount of 180 tablets and was advised the 60 was sent per in absence of MD Tabori per office protocol, pt understood discussion during OV today, brian did discontinue the xanax #60 and will implement the new RX for Xanax 1mg  BID/PRN #180 no refills, pt understood.MD Tabori aware verbally the RX has been changed,

## 2012-03-28 NOTE — Assessment & Plan Note (Signed)
Corrected pt's xanax script.  Pt appreciative.

## 2012-03-28 NOTE — Patient Instructions (Addendum)
Follow up as scheduled Start the Augmentin twice daily (w/ food) If the redness continues to spread or worsen- call me! We'll fix the Alprazolam prescription Call with any questions or concerns Hang in there!!!

## 2012-03-28 NOTE — Assessment & Plan Note (Signed)
Chronic problem.  Pt has decided he is no longer seeing Endo b/c MD was unwilling to use labs drawn here or at renal and insisted on drawing his own.  Apparently also had problem w/ fact that I was [place] MD.  Will manage pt's DM from this point- he has upcoming appt.

## 2012-03-28 NOTE — Assessment & Plan Note (Signed)
New.  Appears to be cellulitis.  Start Augmentin.  Reviewed supportive care and red flags that should prompt return.  Pt expressed understanding and is in agreement w/ plan.

## 2012-03-28 NOTE — Progress Notes (Signed)
  Subjective:    Patient ID: Philip Cain, male    DOB: October 14, 1945, 66 y.o.   MRN: [id number]  HPI DM- chronic problem, no longer seeing Dr Lucianne Muss 'he's a jerk'.  Was referred to nutritionist and felt this was not helpful.  No longer taking Metformin and Glipizide.  On Insulin N 50 units BID and Insulin R 40-40-50 (or adjusts via SSI).  Paying close attention to diet.  Denies symptomatic lows.  Nasal sore- sxs started Saturday.  Painful.  Redness and swelling of L lateral nostril.  Using Neosporin w/out relief.  No drainage but 'it's been getting worse'.  Hx of similar which progressed to eye infxn.  No fevers.  Anxiety- pt was given 60 xanax by covering provider, rather than his usual 180.  Pt would like this corrected as it is more expensive for him to buy meds monthly than every 90 days.   Review of Systems For ROS see HPI     Objective:   Physical Exam  Vitals reviewed. Constitutional: He appears well-developed and well-nourished. No distress.       Morbidly obese  Skin: Skin is warm and dry. There is erythema (w/ scabbing of L lateral nostril externally.  + TTP.  no fluctuance or drainage).          Assessment & Plan:

## 2012-04-26 ENCOUNTER — Ambulatory Visit (INDEPENDENT_AMBULATORY_CARE_PROVIDER_SITE_OTHER): Payer: Medicare Other | Admitting: Family Medicine

## 2012-04-26 ENCOUNTER — Encounter: Payer: Self-pay | Admitting: Family Medicine

## 2012-04-26 VITALS — BP 132/85 | HR 112 | Temp 97.7°F | Ht 69.5 in | Wt 342.0 lb

## 2012-04-26 DIAGNOSIS — E785 Hyperlipidemia, unspecified: Secondary | ICD-10-CM

## 2012-04-26 DIAGNOSIS — E1165 Type 2 diabetes mellitus with hyperglycemia: Secondary | ICD-10-CM

## 2012-04-26 DIAGNOSIS — E349 Endocrine disorder, unspecified: Secondary | ICD-10-CM

## 2012-04-26 DIAGNOSIS — I10 Essential (primary) hypertension: Secondary | ICD-10-CM

## 2012-04-26 DIAGNOSIS — E291 Testicular hypofunction: Secondary | ICD-10-CM

## 2012-04-26 MED ORDER — INSULIN PEN NEEDLE 31G X 6 MM MISC: Status: DC

## 2012-04-26 MED ORDER — SIMVASTATIN 40 MG PO TABS: 40.0000 mg | ORAL_TABLET | Freq: Every day | ORAL | Status: DC

## 2012-04-26 MED ORDER — TAMSULOSIN HCL 0.4 MG PO CAPS: 0.4000 mg | ORAL_CAPSULE | Freq: Every day | ORAL | Status: DC

## 2012-04-26 MED ORDER — TESTOSTERONE 30 MG/ACT TD SOLN: 30.0000 mg | Freq: Every day | TRANSDERMAL | Status: DC

## 2012-04-26 MED ORDER — TORSEMIDE 20 MG PO TABS: 20.0000 mg | ORAL_TABLET | Freq: Every day | ORAL | Status: DC

## 2012-04-26 MED ORDER — INSULIN GLARGINE 100 UNIT/ML ~~LOC~~ SOLN: 40.0000 [IU] | Freq: Every day | SUBCUTANEOUS | Status: DC

## 2012-04-26 NOTE — Patient Instructions (Signed)
Follow up in 1 month- bring your meter or log book to review sugars Start the Lantus 40 units nightly Continue the Humulin R(egular) 35 units w/ meals Apply 1 pump of the Axiron into each armpit daily as directed (total of 2 pumps) Please schedule w/ Dr Everardo All to review your diabetes regimen Call with any questions or concerns Hang in there!!

## 2012-04-26 NOTE — Progress Notes (Signed)
  Subjective:    Patient ID: Philip Cain, male    DOB: 09/24/1945, 66 y.o.   MRN: [id number]  HPI DM- chronic problem, no longer seeing Endo b/c he was unhappy.  On Humulin N 50 units BID and Humulin R 40 units TID.  Had scheduled appt w/ Dr Lanna Poche- but this was cancelled.  Has yet to reschedule.  Wants to switch to Lantus but wants to do this based on samples only.  Is losing weight, plans on starting exercise program.  No longer on Glipizide or Metformin.  UTD on eye exam.  Admits to not following ADA diet, hx of poor compliance.  Has difficulty getting along w/ medical providers and admits that if he doesn't like their advice, he doesn't return.  Hyperlipidemia- chronic problem, last labs done in Feb.  On Zocor 40mg .  Denies abd pain, N/V, myalgias.  Low T- was 285 in Feb.  Was started on Testosterone 5% gel by Dr Ronne Binning.  Was told by pharmacist that 5% gel is not sufficient.  Recommended 10% gel or Axiron.  Does not see Urology regularly.  HTN- chronic problem, adequate control.  Denies CP, SOB unless exerting himself, HAs, visual changes, edema.  On Bisoprolol, Demadex.   Review of Systems For ROS see HPI     Objective:   Physical Exam  Vitals reviewed. Constitutional: He is oriented to person, place, and time. He appears well-developed and well-nourished. No distress.       Morbidly obese  HENT:  Head: Normocephalic and atraumatic.  Eyes: Conjunctivae normal and EOM are normal. Pupils are equal, round, and reactive to light.  Neck: Normal range of motion. Neck supple. No thyromegaly present.  Cardiovascular: Normal rate, regular rhythm, normal heart sounds and intact distal pulses.   No murmur heard. Pulmonary/Chest: Effort normal and breath sounds normal. No respiratory distress.  Abdominal: Soft. Bowel sounds are normal. He exhibits no distension.  Musculoskeletal: He exhibits no edema.  Lymphadenopathy:    He has no cervical adenopathy.  Neurological: He is alert and  oriented to person, place, and time. No cranial nerve deficit.  Skin: Skin is warm and dry.  Psychiatric: He has a normal mood and affect. His behavior is normal.          Assessment & Plan:

## 2012-04-27 LAB — HEPATIC FUNCTION PANEL
ALT: 26 U/L (ref 0–53)
Alkaline Phosphatase: 64 U/L (ref 39–117)
Bilirubin, Direct: 0.2 mg/dL (ref 0.0–0.3)
Total Bilirubin: 0.8 mg/dL (ref 0.3–1.2)
Total Protein: 7.8 g/dL (ref 6.0–8.3)

## 2012-04-27 LAB — LIPID PANEL
Total CHOL/HDL Ratio: 4
Triglycerides: 237 mg/dL — ABNORMAL HIGH (ref 0.0–149.0)

## 2012-04-27 LAB — BASIC METABOLIC PANEL
CO2: 25 mEq/L (ref 19–32)
Calcium: 10.2 mg/dL (ref 8.4–10.5)
Creatinine, Ser: 1.9 mg/dL — ABNORMAL HIGH (ref 0.4–1.5)
GFR: 38.86 mL/min — ABNORMAL LOW (ref >60.00)
Glucose, Bld: 219 mg/dL — ABNORMAL HIGH (ref 70–99)
Sodium: 137 mEq/L (ref 135–145)

## 2012-04-27 LAB — LDL CHOLESTEROL, DIRECT: Direct LDL: 76 mg/dL

## 2012-04-27 LAB — HEMOGLOBIN A1C: Hgb A1c MFr Bld: 11.4 % — ABNORMAL HIGH (ref 4.6–6.5)

## 2012-04-27 LAB — TSH: TSH: 1.26 u[IU]/mL (ref 0.35–5.50)

## 2012-04-28 ENCOUNTER — Telehealth: Payer: Self-pay | Admitting: Family Medicine

## 2012-04-28 NOTE — Telephone Encounter (Signed)
Called [facility] and spoke to pharmacist Thayer Ohm to advise that sonja from [facility] verifed the PA went through, [facility] placed this nurse on hold and sent the Rx through and noted it was accepted, pharmacy will call the pt to advise the Rx is filled

## 2012-04-28 NOTE — Telephone Encounter (Signed)
Sonja from [facility] called to state that AXIRON med is approved for pt.

## 2012-05-01 ENCOUNTER — Encounter: Payer: Self-pay | Admitting: *Deleted

## 2012-05-01 ENCOUNTER — Encounter: Payer: Self-pay | Admitting: Family Medicine

## 2012-05-01 ENCOUNTER — Telehealth: Payer: Self-pay | Admitting: Family Medicine

## 2012-05-01 MED ORDER — FENOFIBRATE 160 MG PO TABS: 160.0000 mg | ORAL_TABLET | Freq: Every day | ORAL | Status: DC

## 2012-05-01 NOTE — Telephone Encounter (Signed)
Spoke to pt to advise results/instructions. Pt understood. Placed card up front for pick up letter mailed to patients home address with results. Referral placed for Endo

## 2012-05-01 NOTE — Telephone Encounter (Signed)
Pt called and has lost the sample coupon card for the med he given last week. He would like another one. He also asked about his lab results and would like to know these.

## 2012-05-04 ENCOUNTER — Ambulatory Visit (INDEPENDENT_AMBULATORY_CARE_PROVIDER_SITE_OTHER): Payer: Medicare Other | Admitting: Endocrinology

## 2012-05-04 VITALS — BP 138/82 | HR 94 | Temp 97.8°F | Wt 358.0 lb

## 2012-05-04 DIAGNOSIS — E1165 Type 2 diabetes mellitus with hyperglycemia: Secondary | ICD-10-CM

## 2012-05-04 NOTE — Assessment & Plan Note (Addendum)
Chronic problem, poor control due to lack of compliance and poor lifestyle choices.  Again stressed importance of seeing Endo (he has seen multiple providers and has not been happy w/ any of them) but prefers for me to manage his DM.  Told him that we are beyond my ability to manage and based on poor A1C, needs to see a specialist.  Did convert his insulin to Lantus and mealtime dosing.  Pt was pleased w/ this.  Will refer him to Dr Everardo All as pt has not seen him yet.  Pt agrees.  Total time spent w/ pt 55 minutes, >50% counseling on above issue.

## 2012-05-04 NOTE — Progress Notes (Signed)
Subjective:    Patient ID: Donnamae Jude, male    DOB: Aug 24, 1945, 66 y.o.   MRN: [id number]  HPI i saw this pt many years ago, but he moved away.  pt states 25 years h/o dm. it is complicated by renal insufficiency.  he has been on insulin x 20 years.  pt says his diet is "fair," and exercise is limited by OA. Pt states few years of moderate arthralgias throughout the body, but no assoc numbness of the feet.   Past Medical History  Diagnosis Date  . Hypertension   . Hyperlipidemia   . History of kidney cancer   . Diabetes mellitus   . OSA (obstructive sleep apnea)   . Kidney failure   . COPD (chronic obstructive pulmonary disease)   . Gout   . Arthritis     Past Surgical History  Procedure Date  . Nephrectomy 1/10    right  . Shoulder surgery 7/05    right  . Revision total hip arthroplasty     bilaterally  . Total knee arthroplasty 2007    left  . Tonsillectomy     History   Social History  . Marital Status: Married    Spouse Name: N/A    Number of Children: 2  . Years of Education: N/A   Occupational History  . Retired     [employer]al architect   Social History Main Topics  . Smoking status: Former Smoker -- 4.0 packs/day for 30 years    Types: Cigarettes    Quit date: 08/23/1994  . Smokeless tobacco: Never Used  . Alcohol Use: No  . Drug Use: No  . Sexually Active: Not on file   Other Topics Concern  . Not on file   Social History Narrative  . No narrative on file    Current Outpatient Prescriptions on File Prior to Visit  Medication Sig Dispense Refill  . albuterol (PROAIR HFA) 108 (90 BASE) MCG/ACT inhaler Inhale 2 puffs into the lungs every 6 (six) hours as needed for wheezing.  18 g  5  . allopurinol (ZYLOPRIM) 100 MG tablet Take 1 tablet (100 mg total) by mouth daily.  90 tablet  1  . ALPRAZolam (XANAX) 1 MG tablet Take 1 tablet (1 mg total) by mouth 2 (two) times daily as needed.  60 tablet  0  . bisoprolol (ZEBETA) 10 MG tablet 1 tab bid  180  tablet  1  . budesonide-formoterol (SYMBICORT) 160-4.5 MCG/ACT inhaler Inhale 2 puffs into the lungs 2 (two) times daily.      . Cholecalciferol (VITAMIN D) 1000 UNITS capsule Take 2,000 Units by mouth daily.       Marland Kitchen COLCRYS 0.6 MG tablet Take 1 tablet by mouth as needed.       . fenofibrate 160 MG tablet Take 1 tablet (160 mg total) by mouth daily.  30 tablet  3  . fluconazole (DIFLUCAN) 200 MG tablet       . glucose blood (ACCU-CHEK INSTANT GLUCOSE TEST) test strip Use as instructed  100 each  12  . HYDROcodone-acetaminophen (NORCO) 5-325 MG per tablet Take 1 tablet by mouth Every 6 hours as needed.      . insulin glargine (LANTUS SOLOSTAR) 100 UNIT/ML injection Inject 40 Units into the skin at bedtime.  5 pen  3  . Insulin Pen Needle (VALUMARK PEN NEEDLES) 31G X 6 MM MISC Use to inject insulin daily  50 each  6  . Insulin Regular Human (HUMULIN  R IJ) 30 units every am, 40 units with lunch and with dinner       . Insulin Syringe-Needle U-100 (INSULIN SYRINGE 1CC/31GX5/16") 31G X 5/16" 1 ML MISC USE AS DIRECTED  100 each  6  . ketoconazole (NIZORAL) 2 % cream USE AS DIRECTED  75 g  3  . levothyroxine (SYNTHROID, LEVOTHROID) 50 MCG tablet Take 1 tablet (50 mcg total) by mouth daily.  90 tablet  3  . methocarbamol (ROBAXIN) 750 MG tablet       . phentermine 37.5 MG capsule Take 37.5 mg by mouth every morning.      . simvastatin (ZOCOR) 40 MG tablet Take 1 tablet (40 mg total) by mouth at bedtime.  90 tablet  1  . Tamsulosin HCl (FLOMAX) 0.4 MG CAPS Take 1 capsule (0.4 mg total) by mouth daily.  90 capsule  1  . Testosterone (AXIRON) 30 MG/ACT SOLN Place 30 mg onto the skin daily.  90 mL  3  . torsemide (DEMADEX) 20 MG tablet Take 1 tablet (20 mg total) by mouth daily.  90 tablet  1    No Known Allergies  Family History  Problem Relation Age of Onset  . Heart disease Mother     Died of CHF  . Stroke Mother   . Hypertension Mother   . Stroke Father   no DM  There were no vitals taken  for this visit.      Review of Systems denies headache, chest pain, sob, n/v, urinary frequency, cramps, excessive diaphoresis, memory loss, depression, rhinorrhea, and easy bruising.  He has lost weight recently.  He has blurry vision.    Objective:   Physical Exam VS: see vs page GEN: no distress.  Morbid obesity HEAD: head: no deformity eyes: no periorbital swelling, no proptosis external nose and ears are normal mouth: no lesion seen NECK: supple, thyroid is not enlarged CHEST WALL: no deformity LUNGS: clear to auscultation CV: reg rate and rhythm, no murmur ABD: abdomen is soft, nontender.  no hepatosplenomegaly.  not distended.  no hernia MUSCULOSKELETAL: muscle bulk and strength are grossly normal.  no obvious joint swelling.  gait is normal and steady EXTEMITIES: no deformity.  no ulcer on the feet.  feet are of normal color and temp.  no edema PULSES: dorsalis pedis intact bilat.  no carotid bruit NEURO:  cn 2-12 grossly intact.   readily moves all 4's.  sensation is intact to touch on the feet SKIN:  Normal texture and temperature.  No rash or suspicious lesion is visible.   NODES:  None palpable at the neck PSYCH: alert, oriented x3.  Does not appear anxious nor depressed.  Lab Results  Component Value Date   HGBA1C 11.4* 04/26/2012      Assessment & Plan:  DM, needs increased rx Sob.  This limits exercise rx of DM Weight-loss, prob due to severe hyperglycemia

## 2012-05-04 NOTE — Assessment & Plan Note (Signed)
Chronic problem, tolerating meds w/out difficulty.  Due for labs.  Adjust meds prn.

## 2012-05-04 NOTE — Patient Instructions (Addendum)
good diet and exercise habits significanly improve the control of your diabetes.  please let me know if you wish to be referred to a dietician.  high blood sugar is very risky to your health.  you should see an eye doctor every year.  You are at higher than average risk for pneumonia and hepatitis-B.  You should be vaccinated against both.   controlling your blood pressure and cholesterol drastically reduces the damage diabetes does to your body.  this also applies to quitting smoking.  please discuss these with your doctor.  you should take an aspirin every day, unless you have been advised by a doctor not to. check your blood sugar twice a day.  vary the time of day when you check, between before the 3 meals, and at bedtime.  also check if you have symptoms of your blood sugar being too high or too low.  please keep a record of the readings and bring it to your next appointment here.  please call us sooner if your blood sugar goes below 70, or if you have a lot of readings over 200.   For now, continue the same lantus, and: incerase the humalog 3 times a day (just before each meal) 40-40-60 units. Please come back for a follow-up appointment in 2 weeks. we will need to take this complex situation in stages

## 2012-05-04 NOTE — Assessment & Plan Note (Signed)
Chronic problem, not well controlled on Testosterone 5% gel.  Pt still symptomatic.  Free coupon for Axiron given and pt to f/u w/ endo.  Pt expressed understanding and is in agreement w/ plan.

## 2012-05-04 NOTE — Assessment & Plan Note (Signed)
chronic problem, adequate control.  Check labs.

## 2012-05-18 ENCOUNTER — Ambulatory Visit: Payer: Medicare Other | Admitting: Endocrinology

## 2012-05-22 ENCOUNTER — Telehealth: Payer: Self-pay | Admitting: *Deleted

## 2012-05-22 NOTE — Telephone Encounter (Signed)
.  left message to have patient return my call to advise that pt was advised by MD Tabori to follow up with her on 05-23-12 concerning his Diabetes, however pt was noted to have established care with Diabetes MD Everardo All on 05-04-12 and was told to come back to [place] on 05-18-12 for follow up, pt will not need to see MD Tabori at this time per MD Everardo All is now managing pt Diabetes

## 2012-05-23 ENCOUNTER — Encounter: Payer: Medicare Other | Admitting: Family Medicine

## 2012-05-23 ENCOUNTER — Telehealth: Payer: Self-pay | Admitting: Family Medicine

## 2012-05-23 NOTE — Telephone Encounter (Signed)
pt wants samples of symbicort & proair, if we have please call [phone]

## 2012-05-23 NOTE — Telephone Encounter (Signed)
Pt returned call. Spoke to pt to advise results/instructions. Pt understood. Pt noted he has an upcoming apt with Everardo All on 05-26-12, MD Beverely Low made aware verbally, apt cancelled today

## 2012-05-23 NOTE — Telephone Encounter (Signed)
Called Philip Cain to advise the samples are at the front desk for pick up, Philip Cain understood advised that MD Beverely Low was happy to see him today however this OV was to address his diabetes and now that he has Everardo All managing his DM, Philip Cain understood and noted that he scheduled an apt with Everardo All on 05-26-12. MD Beverely Low made aware, Philip Cain appointment has been cancelled and he will call office with any further refills needed.

## 2012-05-26 ENCOUNTER — Ambulatory Visit (INDEPENDENT_AMBULATORY_CARE_PROVIDER_SITE_OTHER): Payer: Medicare Other | Admitting: Endocrinology

## 2012-05-26 ENCOUNTER — Encounter: Payer: Self-pay | Admitting: Endocrinology

## 2012-05-26 VITALS — BP 140/84 | Temp 98.9°F | Wt 357.0 lb

## 2012-05-26 DIAGNOSIS — IMO0002 Reserved for concepts with insufficient information to code with codable children: Secondary | ICD-10-CM

## 2012-05-26 DIAGNOSIS — E1165 Type 2 diabetes mellitus with hyperglycemia: Secondary | ICD-10-CM

## 2012-05-26 MED ORDER — INSULIN REGULAR HUMAN 100 UNIT/ML IJ SOLN: INTRAMUSCULAR | Status: DC

## 2012-05-26 MED ORDER — INSULIN NPH (HUMAN) (ISOPHANE) 100 UNIT/ML ~~LOC~~ SUSP: 30.0000 [IU] | Freq: Every day | SUBCUTANEOUS | Status: DC

## 2012-05-26 NOTE — Patient Instructions (Addendum)
Change lantus to NPH, 30 units at bedtime.  change the humalog to regular insulin, 3 times a day (just before each meal) 40-40-60 units.   Please come back for a follow-up appointment for 1 month.  check your blood sugar twice a day.  vary the time of day when you check, between before the 3 meals, and at bedtime.  also check if you have symptoms of your blood sugar being too high or too low.  please keep a record of the readings and bring it to your next appointment here.  please call us sooner if your blood sugar goes below 70, or if you have a lot of readings over 200.

## 2012-05-26 NOTE — Progress Notes (Signed)
Subjective:    Patient ID: Philip Cain, male    DOB: 1946/05/25, 66 y.o.   MRN: [id number]  HPI pt returns for f/u of insulin-requiring DM (dx'ed 1997; complicated by renal insufficiency).  he brings a record of his cbg's which i have reviewed today.  It varies from 110-200's.  It is highest at hs, and lower at all other times.  He says he needs a cheaper insulin.   Past Medical History  Diagnosis Date  . Hypertension   . Hyperlipidemia   . History of kidney cancer   . Diabetes mellitus   . OSA (obstructive sleep apnea)   . Kidney failure   . COPD (chronic obstructive pulmonary disease)   . Gout   . Arthritis     Past Surgical History  Procedure Date  . Nephrectomy 1/10    right  . Shoulder surgery 7/05    right  . Revision total hip arthroplasty     bilaterally  . Total knee arthroplasty 2007    left  . Tonsillectomy     History   Social History  . Marital Status: Married    Spouse Name: N/A    Number of Children: 2  . Years of Education: N/A   Occupational History  . Retired     Naval architect   Social History Main Topics  . Smoking status: Former Smoker -- 4.0 packs/day for 30 years    Types: Cigarettes    Quit date: 08/23/1994  . Smokeless tobacco: Never Used  . Alcohol Use: No  . Drug Use: No  . Sexually Active: Not on file   Other Topics Concern  . Not on file   Social History Narrative  . No narrative on file    Current Outpatient Prescriptions on File Prior to Visit  Medication Sig Dispense Refill  . albuterol (PROAIR HFA) 108 (90 BASE) MCG/ACT inhaler Inhale 2 puffs into the lungs every 6 (six) hours as needed for wheezing.  18 g  5  . allopurinol (ZYLOPRIM) 100 MG tablet Take 1 tablet (100 mg total) by mouth daily.  90 tablet  1  . ALPRAZolam (XANAX) 1 MG tablet Take 1 tablet (1 mg total) by mouth 2 (two) times daily as needed.  60 tablet  0  . bisoprolol (ZEBETA) 10 MG tablet 1 tab bid  180 tablet  1  . budesonide-formoterol (SYMBICORT)  160-4.5 MCG/ACT inhaler Inhale 2 puffs into the lungs 2 (two) times daily.      . Cholecalciferol (VITAMIN D) 1000 UNITS capsule Take 2,000 Units by mouth daily.       Marland Kitchen COLCRYS 0.6 MG tablet Take 1 tablet by mouth as needed.       . fenofibrate 160 MG tablet Take 1 tablet (160 mg total) by mouth daily.  30 tablet  3  . fluconazole (DIFLUCAN) 200 MG tablet       . glucose blood (ACCU-CHEK INSTANT GLUCOSE TEST) test strip Use as instructed  100 each  12  . Insulin Pen Needle (VALUMARK PEN NEEDLES) 31G X 6 MM MISC Use to inject insulin daily  50 each  6  . Insulin Syringe-Needle U-100 (INSULIN SYRINGE 1CC/31GX5/16") 31G X 5/16" 1 ML MISC USE AS DIRECTED  100 each  6  . ketoconazole (NIZORAL) 2 % cream USE AS DIRECTED  75 g  3  . levothyroxine (SYNTHROID, LEVOTHROID) 50 MCG tablet Take 1 tablet (50 mcg total) by mouth daily.  90 tablet  3  . methocarbamol (  ROBAXIN) 750 MG tablet       . phentermine 37.5 MG capsule Take 37.5 mg by mouth every morning.      . simvastatin (ZOCOR) 40 MG tablet Take 1 tablet (40 mg total) by mouth at bedtime.  90 tablet  1  . Tamsulosin HCl (FLOMAX) 0.4 MG CAPS Take 1 capsule (0.4 mg total) by mouth daily.  90 capsule  1  . Testosterone (AXIRON) 30 MG/ACT SOLN Place 30 mg onto the skin daily.  90 mL  3  . torsemide (DEMADEX) 20 MG tablet Take 1 tablet (20 mg total) by mouth daily.  90 tablet  1    No Known Allergies  Family History  Problem Relation Age of Onset  . Heart disease Mother     Died of CHF  . Stroke Mother   . Hypertension Mother   . Stroke Father     BP 140/84  Temp 98.9 F (37.2 C) (Oral)  Wt 357 lb (161.934 kg)  Review of Systems denies hypoglycemia    Objective:   Physical Exam VITAL SIGNS:  See vs page GENERAL: no distress SKIN:  Insulin injection sites at the anterior abdomen are normal     Assessment & Plan:  DM, therapy limited by pt's request for least expensive meds

## 2012-05-29 ENCOUNTER — Telehealth: Payer: Self-pay

## 2012-05-30 NOTE — Progress Notes (Signed)
This encounter was created in error - please disregard.

## 2012-06-22 ENCOUNTER — Ambulatory Visit (INDEPENDENT_AMBULATORY_CARE_PROVIDER_SITE_OTHER): Payer: Medicare Other | Admitting: Family Medicine

## 2012-06-22 ENCOUNTER — Encounter: Payer: Self-pay | Admitting: Family Medicine

## 2012-06-22 VITALS — BP 110/80 | HR 93 | Temp 98.2°F | Ht 72.0 in | Wt 352.0 lb

## 2012-06-22 DIAGNOSIS — I889 Nonspecific lymphadenitis, unspecified: Secondary | ICD-10-CM | POA: Insufficient documentation

## 2012-06-22 DIAGNOSIS — E785 Hyperlipidemia, unspecified: Secondary | ICD-10-CM

## 2012-06-22 DIAGNOSIS — R0982 Postnasal drip: Secondary | ICD-10-CM | POA: Insufficient documentation

## 2012-06-22 DIAGNOSIS — I10 Essential (primary) hypertension: Secondary | ICD-10-CM

## 2012-06-22 MED ORDER — FENOFIBRATE 160 MG PO TABS: 160.0000 mg | ORAL_TABLET | Freq: Every day | ORAL | Status: DC

## 2012-06-22 MED ORDER — TORSEMIDE 20 MG PO TABS: 20.0000 mg | ORAL_TABLET | Freq: Every day | ORAL | Status: DC

## 2012-06-22 MED ORDER — ALPRAZOLAM 1 MG PO TABS: 1.0000 mg | ORAL_TABLET | Freq: Two times a day (BID) | ORAL | Status: DC | PRN

## 2012-06-22 MED ORDER — TAMSULOSIN HCL 0.4 MG PO CAPS: 0.4000 mg | ORAL_CAPSULE | Freq: Every day | ORAL | Status: DC

## 2012-06-22 MED ORDER — FLUTICASONE PROPIONATE 50 MCG/ACT NA SUSP: 2.0000 | Freq: Every day | NASAL | Status: AC

## 2012-06-22 MED ORDER — AMOXICILLIN-POT CLAVULANATE 875-125 MG PO TABS: 1.0000 | ORAL_TABLET | Freq: Two times a day (BID) | ORAL | Status: DC

## 2012-06-22 MED ORDER — BISOPROLOL FUMARATE 10 MG PO TABS: ORAL_TABLET | ORAL | Status: DC

## 2012-06-22 MED ORDER — ALLOPURINOL 100 MG PO TABS: 100.0000 mg | ORAL_TABLET | Freq: Every day | ORAL | Status: DC

## 2012-06-22 MED ORDER — PHENTERMINE HCL 37.5 MG PO CAPS: 37.5000 mg | ORAL_CAPSULE | ORAL | Status: DC

## 2012-06-22 MED ORDER — LEVOTHYROXINE SODIUM 50 MCG PO TABS: 50.0000 ug | ORAL_TABLET | Freq: Every day | ORAL | Status: DC

## 2012-06-22 MED ORDER — SIMVASTATIN 40 MG PO TABS: 40.0000 mg | ORAL_TABLET | Freq: Every day | ORAL | Status: DC

## 2012-06-22 NOTE — Progress Notes (Signed)
  Subjective:    Patient ID: Philip Cain, male    DOB: 24-Dec-1945, 66 y.o.   MRN: [id number]  HPI Cervical LAD- R sided, 'painful as hell', productive cough.  sxs started on Sunday.  No fevers.  + nasal congestion.  Pt reports he needs cough to clear up b/c he has upcoming cataract surgery.  Doesn't take claritin or Zyrtec for seasonal allergies.  Pt reports chronic cough.  Using inhalers regularly.   Review of Systems For ROS see HPI     Objective:   Physical Exam  Constitutional: He appears well-developed and well-nourished. No distress.  HENT:  Head: Normocephalic and atraumatic.       No TTP over sinuses + turbinate edema + PND TMs normal bilaterally  Eyes: Conjunctivae normal and EOM are normal. Pupils are equal, round, and reactive to light.  Neck: Normal range of motion. Neck supple.  Cardiovascular: Normal rate, regular rhythm and normal heart sounds.   Pulmonary/Chest: Effort normal and breath sounds normal. No respiratory distress. He has no wheezes.       + hacking cough  Lymphadenopathy:    He has cervical adenopathy (R sided, anterior cervical chain, +TTP).  Skin: Skin is warm and dry.          Assessment & Plan:

## 2012-06-22 NOTE — Patient Instructions (Addendum)
Start the Augmentin for the swollen, painful lymph nodes (lymphadenitis)- take w/ food Start the nasal spray- 2 sprays each nostril twice daily- to decrease the post nasal drip and cough Drink plenty of fluids If no improvement in the pain or swelling of your neck- please call and we'll arrange imaging Call with any questions or concerns Hang in there!!

## 2012-06-27 NOTE — Assessment & Plan Note (Signed)
New.  Suspect this is cause of pt's chronic cough.  Start nasal steroid and OTC antihistamine.  Cough syrup for upcoming cataract surgery.  Reviewed supportive care and red flags that should prompt return.  Pt expressed understanding and is in agreement w/ plan.

## 2012-06-27 NOTE — Assessment & Plan Note (Signed)
New.  No obvious cause.  Start abx for infxn.  If no improvement, pt to call and I will arrange imaging for further eval.  Pt expressed understanding and is in agreement w/ plan.

## 2012-06-28 ENCOUNTER — Ambulatory Visit (INDEPENDENT_AMBULATORY_CARE_PROVIDER_SITE_OTHER): Payer: Medicare Other | Admitting: Endocrinology

## 2012-06-28 ENCOUNTER — Ambulatory Visit: Payer: Medicare Other | Admitting: Endocrinology

## 2012-06-28 VITALS — BP 132/80 | HR 81 | Temp 97.5°F | Wt 363.0 lb

## 2012-06-28 DIAGNOSIS — E1165 Type 2 diabetes mellitus with hyperglycemia: Secondary | ICD-10-CM

## 2012-06-28 NOTE — Patient Instructions (Addendum)
Reduce NPH to 20 units at bedtime.   change the humalog to regular insulin, 3 times a day (just before each meal) 40-50-70 units.   Please come back for a follow-up appointment in 1 month.  check your blood sugar twice a day.  vary the time of day when you check, between before the 3 meals, and at bedtime.  also check if you have symptoms of your blood sugar being too high or too low.  please keep a record of the readings and bring it to your next appointment here.  please call us sooner if your blood sugar goes below 70, or if you have a lot of readings over 200.

## 2012-06-28 NOTE — Progress Notes (Signed)
Subjective:    Patient ID: Philip Cain, male    DOB: [birth year], 66 y.o.   MRN: [id number]  HPI pt returns for f/u of insulin-requiring DM (dx'ed 1997; complicated by renal insufficiency; therapy has been limited by pt's request for least expensive meds).  no cbg record, but states cbg's vary from 110-200.  It is highest at hs, and lower at all other times.   Past Medical History  Diagnosis Date  . Hypertension   . Hyperlipidemia   . History of kidney cancer   . Diabetes mellitus   . OSA (obstructive sleep apnea)   . Kidney failure   . COPD (chronic obstructive pulmonary disease)   . Gout   . Arthritis     Past Surgical History  Procedure Date  . Nephrectomy 1/10    right  . Shoulder surgery 7/05    right  . Revision total hip arthroplasty     bilaterally  . Total knee arthroplasty 2007    left  . Tonsillectomy     History   Social History  . Marital Status: Married    Spouse Name: N/A    Number of Children: 2  . Years of Education: N/A   Occupational History  . Retired     Naval architect   Social History Main Topics  . Smoking status: Former Smoker -- 4.0 packs/day for 30 years    Types: Cigarettes    Quit date: 08/23/1994  . Smokeless tobacco: Never Used  . Alcohol Use: No  . Drug Use: No  . Sexually Active: Not on file   Other Topics Concern  . Not on file   Social History Narrative  . No narrative on file    Current Outpatient Prescriptions on File Prior to Visit  Medication Sig Dispense Refill  . albuterol (PROAIR HFA) 108 (90 BASE) MCG/ACT inhaler Inhale 2 puffs into the lungs every 6 (six) hours as needed for wheezing.  18 g  5  . allopurinol (ZYLOPRIM) 100 MG tablet Take 1 tablet (100 mg total) by mouth daily.  90 tablet  0  . ALPRAZolam (XANAX) 1 MG tablet Take 1 tablet (1 mg total) by mouth 2 (two) times daily as needed.  180 tablet  0  . amoxicillin-clavulanate (AUGMENTIN) 875-125 MG per tablet Take 1 tablet by mouth 2 (two) times daily.   20 tablet  0  . bisoprolol (ZEBETA) 10 MG tablet 1 tab bid  180 tablet  0  . budesonide-formoterol (SYMBICORT) 160-4.5 MCG/ACT inhaler Inhale 2 puffs into the lungs 2 (two) times daily.      . Cholecalciferol (VITAMIN D) 1000 UNITS capsule Take 2,000 Units by mouth daily.       Marland Kitchen COLCRYS 0.6 MG tablet Take 1 tablet by mouth as needed.       . fenofibrate 160 MG tablet Take 1 tablet (160 mg total) by mouth daily.  90 tablet  0  . fluconazole (DIFLUCAN) 200 MG tablet       . fluticasone (FLONASE) 50 MCG/ACT nasal spray Place 2 sprays into the nose daily.  16 g  6  . glucose blood (ACCU-CHEK INSTANT GLUCOSE TEST) test strip Use as instructed  100 each  12  . Insulin Pen Needle (VALUMARK PEN NEEDLES) 31G X 6 MM MISC Use to inject insulin daily  50 each  6  . Insulin Syringe-Needle U-100 (INSULIN SYRINGE 1CC/31GX5/16") 31G X 5/16" 1 ML MISC USE AS DIRECTED  100 each  6  . ketoconazole (  NIZORAL) 2 % cream USE AS DIRECTED  75 g  3  . levothyroxine (SYNTHROID, LEVOTHROID) 50 MCG tablet Take 1 tablet (50 mcg total) by mouth daily.  90 tablet  0  . methocarbamol (ROBAXIN) 750 MG tablet       . phentermine 37.5 MG capsule Take 1 capsule (37.5 mg total) by mouth every morning.  90 capsule  0  . simvastatin (ZOCOR) 40 MG tablet Take 1 tablet (40 mg total) by mouth at bedtime.  90 tablet  0  . Tamsulosin HCl (FLOMAX) 0.4 MG CAPS Take 1 capsule (0.4 mg total) by mouth daily.  90 capsule  0  . Testosterone (AXIRON) 30 MG/ACT SOLN Place 30 mg onto the skin daily.  90 mL  3  . torsemide (DEMADEX) 20 MG tablet Take 1 tablet (20 mg total) by mouth daily.  90 tablet  0    No Known Allergies  Family History  Problem Relation Age of Onset  . Heart disease Mother     Died of CHF  . Stroke Mother   . Hypertension Mother   . Stroke Father     BP 132/80  Pulse 81  Temp 97.5 F (36.4 C) (Oral)  Wt 363 lb (164.656 kg)  SpO2 92%  Review of Systems denies hypoglycemia    Objective:   Physical Exam VITAL  SIGNS:  See vs page GENERAL: no distress PSYCH: Alert and oriented x 3.  Does not appear anxious nor depressed.    Assessment & Plan:  DM, The pattern of his cbg's indicates he needs some adjustment in his therapy

## 2012-08-04 ENCOUNTER — Ambulatory Visit: Payer: Medicare Other | Admitting: Endocrinology

## 2012-09-13 ENCOUNTER — Encounter: Payer: Self-pay | Admitting: Lab

## 2012-09-14 ENCOUNTER — Encounter: Payer: Self-pay | Admitting: *Deleted

## 2012-09-14 ENCOUNTER — Ambulatory Visit (INDEPENDENT_AMBULATORY_CARE_PROVIDER_SITE_OTHER): Payer: Medicare Other | Admitting: Family Medicine

## 2012-09-14 ENCOUNTER — Encounter: Payer: Self-pay | Admitting: Family Medicine

## 2012-09-14 ENCOUNTER — Telehealth: Payer: Self-pay | Admitting: *Deleted

## 2012-09-14 VITALS — BP 138/68 | HR 85 | Temp 98.0°F | Ht 72.0 in | Wt 366.0 lb

## 2012-09-14 DIAGNOSIS — E785 Hyperlipidemia, unspecified: Secondary | ICD-10-CM

## 2012-09-14 DIAGNOSIS — I10 Essential (primary) hypertension: Secondary | ICD-10-CM

## 2012-09-14 DIAGNOSIS — M1990 Unspecified osteoarthritis, unspecified site: Secondary | ICD-10-CM

## 2012-09-14 DIAGNOSIS — IMO0002 Reserved for concepts with insufficient information to code with codable children: Secondary | ICD-10-CM

## 2012-09-14 DIAGNOSIS — E1165 Type 2 diabetes mellitus with hyperglycemia: Secondary | ICD-10-CM

## 2012-09-14 DIAGNOSIS — IMO0001 Reserved for inherently not codable concepts without codable children: Secondary | ICD-10-CM

## 2012-09-14 LAB — LIPID PANEL
Cholesterol: 131 mg/dL (ref 0–200)
Triglycerides: 161 mg/dL — ABNORMAL HIGH (ref 0.0–149.0)

## 2012-09-14 LAB — BASIC METABOLIC PANEL
BUN: 39 mg/dL — ABNORMAL HIGH (ref 6–23)
Chloride: 102 mEq/L (ref 96–112)
Creatinine, Ser: 2.1 mg/dL — ABNORMAL HIGH (ref 0.4–1.5)
Glucose, Bld: 303 mg/dL — ABNORMAL HIGH (ref 70–99)
Potassium: 3.8 mEq/L (ref 3.5–5.1)

## 2012-09-14 LAB — HEPATIC FUNCTION PANEL
ALT: 38 U/L (ref 0–53)
AST: 51 U/L — ABNORMAL HIGH (ref 0–37)
Albumin: 3.9 g/dL (ref 3.5–5.2)

## 2012-09-14 LAB — MICROALBUMIN / CREATININE URINE RATIO
Creatinine,U: 22 mg/dL
Microalb Creat Ratio: 5.9 mg/g (ref 0.0–30.0)
Microalb, Ur: 1.3 mg/dL (ref 0.0–1.9)

## 2012-09-14 MED ORDER — LEVOTHYROXINE SODIUM 50 MCG PO TABS: 50.0000 ug | ORAL_TABLET | Freq: Every day | ORAL | Status: DC

## 2012-09-14 MED ORDER — ALLOPURINOL 100 MG PO TABS: 100.0000 mg | ORAL_TABLET | Freq: Every day | ORAL | Status: AC

## 2012-09-14 MED ORDER — FENOFIBRATE 160 MG PO TABS: 160.0000 mg | ORAL_TABLET | Freq: Every day | ORAL | Status: DC

## 2012-09-14 MED ORDER — ALBUTEROL SULFATE HFA 108 (90 BASE) MCG/ACT IN AERS: 2.0000 | INHALATION_SPRAY | Freq: Four times a day (QID) | RESPIRATORY_TRACT | Status: AC | PRN

## 2012-09-14 MED ORDER — HYDROCODONE-ACETAMINOPHEN 5-325 MG PO TABS: 1.0000 | ORAL_TABLET | Freq: Four times a day (QID) | ORAL | Status: DC | PRN

## 2012-09-14 MED ORDER — BUDESONIDE-FORMOTEROL FUMARATE 160-4.5 MCG/ACT IN AERO: 2.0000 | INHALATION_SPRAY | Freq: Two times a day (BID) | RESPIRATORY_TRACT | Status: AC

## 2012-09-14 MED ORDER — INSULIN NPH (HUMAN) (ISOPHANE) 100 UNIT/ML ~~LOC~~ SUSP: 20.0000 [IU] | Freq: Every day | SUBCUTANEOUS | Status: DC

## 2012-09-14 MED ORDER — ALPRAZOLAM 1 MG PO TABS: 1.0000 mg | ORAL_TABLET | Freq: Two times a day (BID) | ORAL | Status: DC | PRN

## 2012-09-14 MED ORDER — SIMVASTATIN 40 MG PO TABS: 40.0000 mg | ORAL_TABLET | Freq: Every day | ORAL | Status: AC

## 2012-09-14 MED ORDER — TAMSULOSIN HCL 0.4 MG PO CAPS: 0.4000 mg | ORAL_CAPSULE | Freq: Every day | ORAL | Status: DC

## 2012-09-14 MED ORDER — PHENTERMINE HCL 37.5 MG PO CAPS: 37.5000 mg | ORAL_CAPSULE | ORAL | Status: DC

## 2012-09-14 MED ORDER — BISOPROLOL FUMARATE 10 MG PO TABS: ORAL_TABLET | ORAL | Status: AC

## 2012-09-14 MED ORDER — INSULIN REGULAR HUMAN 100 UNIT/ML IJ SOLN: INTRAMUSCULAR | Status: DC

## 2012-09-14 MED ORDER — TORSEMIDE 20 MG PO TABS: 20.0000 mg | ORAL_TABLET | Freq: Every day | ORAL | Status: AC

## 2012-09-14 NOTE — Progress Notes (Signed)
  Subjective:    Patient ID: Philip Cain, male    DOB: 1945/12/05, 67 y.o.   MRN: [id number]  HPI DM- chronic problem, has been alternating between PCP and Endo for tx.  Pt reports Endo copay is $40 and 'i can't afford it'.  Pt reports he decreased his insulin amount and restarted left over Glipizide and 'my sugars were better'.  Is taking 30-40 units of R w/ breakfast and dinner, taking 60 units of N BID.  Typically doesn't take R w/ midday meal.  Can't afford Lantus (recommended by Endo)  Denies symptomatic lows.  UTD on eye exam- has cataract surgery upcoming.  HTN- chronic problem, adequate control today on Bisoprolol, torsemide.  No CP, SOB above baseline, HAs, visual changes (w/ exception of cataract), edema above baseline.  Hyperlipidemia- chronic problem, on Zocor, fenofibrate.  Due for labs.  Denies abd pain, N/V, myalgias.  OA- chronic problem, was seeing Dr Orma Flaming but doesn't want to pay specialist copay for for Vicodin refill.   Review of Systems For ROS see HPI     Objective:   Physical Exam  Vitals reviewed. Constitutional: He is oriented to person, place, and time. He appears well-developed and well-nourished. No distress.       Morbidly obese  HENT:  Head: Normocephalic and atraumatic.  Eyes: Conjunctivae normal and EOM are normal. Pupils are equal, round, and reactive to light.  Neck: Normal range of motion. Neck supple. No thyromegaly present.  Cardiovascular: Normal rate, regular rhythm, normal heart sounds and intact distal pulses.   No murmur heard. Pulmonary/Chest: Effort normal and breath sounds normal. No respiratory distress.  Abdominal: Soft. Bowel sounds are normal. He exhibits no distension.  Musculoskeletal: He exhibits no edema.  Lymphadenopathy:    He has no cervical adenopathy.  Neurological: He is alert and oriented to person, place, and time. No cranial nerve deficit.  Skin: Skin is warm and dry.  Psychiatric: He has a normal mood and affect.  His behavior is normal.          Assessment & Plan:

## 2012-09-14 NOTE — Assessment & Plan Note (Signed)
Pt again has stopped seeing Endo and has self adjusted meds.  Admits to poor eating during the holidays.  Is exercising.  UTD on eye exam.  Pt is again asking me to manage DM due to cost.  Will get A1C and determine what changes need to be made based on this result.

## 2012-09-14 NOTE — Telephone Encounter (Signed)
Agree w/ above

## 2012-09-14 NOTE — Telephone Encounter (Signed)
Patient was sent to the lab for urine specimen collection in order to collect UDS. Patient stated to lab tech that he could not urinate, so oral swab was offered. Patient made statement to lab tech that "you know I will be salivating a lot with you here", this is an inappropriate statement to make to any staff member. During the time the swab was being collected and orders being entered in EPIC, the patient made gestures toward staff member as if he were squeezing her breasts but did not physically touch the staff member. This made the staff member so uncomfortable that she contacted her immediate supervisor to complain. This kind of behavior is inappropriate and unacceptable. This issue was discussed with the site manager and immediate dismissal from this practice.

## 2012-09-14 NOTE — Patient Instructions (Addendum)
Follow up in 3 months to recheck diabetes We'll notify you of your lab results and adjust meds as needed Continue to exercise and make healthy food choices Call with any questions or concerns Happy New Year!

## 2012-09-14 NOTE — Assessment & Plan Note (Signed)
New to provider, ongoing for pt.  Will supply pt w/ Vicodin for prn use.  Controlled substance agreement signed and drug testing done.

## 2012-09-14 NOTE — Assessment & Plan Note (Signed)
Chronic problem, adequate control.  Did not tolerate ACE previously although this would be ideal in setting of DM.  No anticipated med changes.  Will continue to follow.

## 2012-09-14 NOTE — Assessment & Plan Note (Signed)
Chronic problem.  Due for labs.  Adjust meds prn. 

## 2012-09-21 ENCOUNTER — Telehealth: Payer: Self-pay | Admitting: *Deleted

## 2012-09-21 NOTE — Telephone Encounter (Signed)
Pt left 2 VM stating that he has not heard anything about his labs result and would like to get a call back today so he can know if he needs to change his med or not. Please advise on labs   Called Pt back advise labs have not been reviewed will f/u with Pt once labs have been reviewed, Pt ok

## 2012-09-22 ENCOUNTER — Encounter: Payer: Self-pay | Admitting: Family Medicine

## 2012-09-22 NOTE — Telephone Encounter (Signed)
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  Dismissal letter initiated.

## 2012-09-25 NOTE — Telephone Encounter (Signed)
Labs reviewed.  please see result note

## 2012-09-25 NOTE — Telephone Encounter (Signed)
LM @ (4:29pm) asking the pt to RTC.  See result note.//AB/CMA

## 2012-09-27 ENCOUNTER — Other Ambulatory Visit: Payer: Self-pay | Admitting: Family Medicine

## 2012-09-27 NOTE — Telephone Encounter (Signed)
No- pt is not supposed to be taking this.  Philip Cain discussed this w/ him when reviewing his labs.

## 2012-09-27 NOTE — Telephone Encounter (Signed)
I do not see med listed on pt's current med list. OK to refill? Please advise.

## 2012-09-27 NOTE — Telephone Encounter (Signed)
refill Glipizide 10MG  tab #180 take one tablet twice daily last fill 2.18.13

## 2012-09-28 NOTE — Telephone Encounter (Signed)
Verbally called and advised pharmacy.

## 2012-10-03 ENCOUNTER — Telehealth: Payer: Self-pay | Admitting: Family Medicine

## 2012-10-03 NOTE — Telephone Encounter (Addendum)
Patient dismissed from [facility] by Neena Rhymes, MD effective 09/22/2012. Dismissal letter sent out by certified / registered mail on 10/03/2012. rmf  Received signed domestic return receipt verifying delivery of certified letter.  Article number [id number] on 10/09/2012. rmf

## 2012-10-04 ENCOUNTER — Ambulatory Visit: Payer: Medicare Other | Admitting: Family Medicine

## 2012-10-06 ENCOUNTER — Ambulatory Visit: Payer: Medicare Other | Admitting: Family Medicine

## 2012-10-09 ENCOUNTER — Encounter: Payer: Self-pay | Admitting: Family Medicine

## 2012-10-11 ENCOUNTER — Encounter: Payer: Self-pay | Admitting: Family Medicine

## 2012-10-11 ENCOUNTER — Ambulatory Visit (INDEPENDENT_AMBULATORY_CARE_PROVIDER_SITE_OTHER): Payer: Medicare Other | Admitting: Family Medicine

## 2012-10-11 VITALS — BP 118/70 | HR 112 | Temp 97.9°F | Ht 72.0 in | Wt 362.1 lb

## 2012-10-11 DIAGNOSIS — J449 Chronic obstructive pulmonary disease, unspecified: Secondary | ICD-10-CM

## 2012-10-11 MED ORDER — AZITHROMYCIN 250 MG PO TABS: ORAL_TABLET | ORAL | Status: DC

## 2012-10-11 MED ORDER — BENZONATATE 200 MG PO CAPS: 200.0000 mg | ORAL_CAPSULE | Freq: Three times a day (TID) | ORAL | Status: DC | PRN

## 2012-10-11 NOTE — Patient Instructions (Addendum)
Go to the [facility] and get your chest xray Start the Zpack as directed Use the tessalon as needed for cough Call with any questions or concerns

## 2012-10-11 NOTE — Progress Notes (Signed)
  Subjective:    Patient ID: Philip Cain, male    DOB: 1945-09-10, 67 y.o.   MRN: [id number]  HPI Cough- 'i don't feel well'.  Pt reports cough productive of clear sputum but is turning yellow.  + swollen glands.  Sore throat.  No fevers.  + HA and sinus pressure.  Unable to have cataract surgery if coughing.   Review of Systems For ROS see HPI     Objective:   Physical Exam  Vitals reviewed. Constitutional: He appears well-developed and well-nourished. No distress.  HENT:  Head: Normocephalic and atraumatic.  Right Ear: Tympanic membrane normal.  Left Ear: Tympanic membrane normal.  Nose: No mucosal edema or rhinorrhea. Right sinus exhibits no maxillary sinus tenderness and no frontal sinus tenderness. Left sinus exhibits no maxillary sinus tenderness and no frontal sinus tenderness.  Mouth/Throat: Mucous membranes are normal. No oropharyngeal exudate, posterior oropharyngeal edema or posterior oropharyngeal erythema.  Eyes: Conjunctivae and EOM are normal. Pupils are equal, round, and reactive to light.  Neck: Normal range of motion. Neck supple.  Cardiovascular: Normal rate, regular rhythm and normal heart sounds.   Pulmonary/Chest: Effort normal and breath sounds normal. No respiratory distress. He has no wheezes.  + hacking cough  Lymphadenopathy:    He has no cervical adenopathy.  Skin: Skin is warm and dry.          Assessment & Plan:

## 2012-10-12 ENCOUNTER — Ambulatory Visit (INDEPENDENT_AMBULATORY_CARE_PROVIDER_SITE_OTHER): Payer: Medicare Other

## 2012-10-12 DIAGNOSIS — R05 Cough: Secondary | ICD-10-CM

## 2012-10-20 ENCOUNTER — Telehealth: Payer: Self-pay | Admitting: Family Medicine

## 2012-10-20 ENCOUNTER — Telehealth: Payer: Self-pay | Admitting: *Deleted

## 2012-10-20 NOTE — Telephone Encounter (Signed)
pt called states he thinks none of the meds he was recently given are working. he also wants something to prevent him from coughin when he goes in for eye surgery-cb# [phone] Pt also wants to know if he will be able to continue to come here to see dr.tabori

## 2012-10-20 NOTE — Telephone Encounter (Signed)
Patient called office wanted to know if Dr. Beverely Low could give him something for his cough so he could have his cataract surgery. I advised patient that he has been dismissed form practice and Dr. Beverely Low would no longer be providing his medical care. Patient became very angry and began cursing stating that "he was going to sue that fat lab b___. He was not going to put up with this f____ s___. " Patient went on to state that he "didn't have any money but his old man did and he was going to take that b___ to court over this." I was finally able to get the patient to calm down and direct him to the UC if he required medical care and to begin looking for a new PCP. I advised when he found a new PCP that we would be glad to send his records to them.

## 2012-10-22 NOTE — Assessment & Plan Note (Signed)
Ongoing problem for pt.  Get CXR.  Start Zpack given underlying COPD.  Cough meds prn.  Reviewed supportive care and red flags that should prompt return.  Pt expressed understanding and is in agreement w/ plan.

## 2012-10-24 ENCOUNTER — Telehealth: Payer: Self-pay | Admitting: Family Medicine

## 2012-10-24 NOTE — Telephone Encounter (Signed)
noted 

## 2012-10-24 NOTE — Telephone Encounter (Signed)
Message copied by Verner Chol on Tue Oct 24, 2012  4:27 PM ------      Message from: Elwin Sleight      Created: Tue Oct 24, 2012  3:42 PM      Contact: Philip Maduro "[name]"        Philip Cain has discussed this with patient. Any future calls are to be referred directly to Philip Cain, please.      ----- Message -----         From: Philip Cain         Sent: 10/12/2012   1:57 PM           To: Elwin Sleight, Sheliah Hatch, MD, #            Pt called ask me to let you know he really needs Dr.Tabori to be his doctor and he is willing to have his labs drawn at the [facility] so he would not have to have any contact with the ladies in the lab            I       ------

## 2012-12-01 ENCOUNTER — Ambulatory Visit: Payer: Medicare Other | Admitting: Family Medicine

## 2012-12-06 ENCOUNTER — Encounter: Payer: Self-pay | Admitting: Sports Medicine

## 2012-12-06 ENCOUNTER — Ambulatory Visit (INDEPENDENT_AMBULATORY_CARE_PROVIDER_SITE_OTHER): Payer: Medicare Other | Admitting: Sports Medicine

## 2012-12-06 ENCOUNTER — Ambulatory Visit (INDEPENDENT_AMBULATORY_CARE_PROVIDER_SITE_OTHER): Payer: Medicare Other

## 2012-12-06 VITALS — BP 139/69 | HR 94 | Resp 20 | Wt 372.0 lb

## 2012-12-06 DIAGNOSIS — J4489 Other specified chronic obstructive pulmonary disease: Secondary | ICD-10-CM

## 2012-12-06 DIAGNOSIS — J449 Chronic obstructive pulmonary disease, unspecified: Secondary | ICD-10-CM

## 2012-12-06 DIAGNOSIS — F419 Anxiety disorder, unspecified: Secondary | ICD-10-CM

## 2012-12-06 DIAGNOSIS — R05 Cough: Secondary | ICD-10-CM

## 2012-12-06 DIAGNOSIS — F341 Dysthymic disorder: Secondary | ICD-10-CM

## 2012-12-06 DIAGNOSIS — F32A Depression, unspecified: Secondary | ICD-10-CM

## 2012-12-06 MED ORDER — DOXYCYCLINE HYCLATE 100 MG PO TABS: 100.0000 mg | ORAL_TABLET | Freq: Two times a day (BID) | ORAL | Status: AC

## 2012-12-06 MED ORDER — CITALOPRAM HYDROBROMIDE 20 MG PO TABS: 20.0000 mg | ORAL_TABLET | Freq: Every day | ORAL | Status: DC

## 2012-12-06 MED ORDER — MONTELUKAST SODIUM 10 MG PO TABS: 10.0000 mg | ORAL_TABLET | Freq: Every day | ORAL | Status: AC

## 2012-12-06 MED ORDER — PREDNISONE 50 MG PO TABS: 50.0000 mg | ORAL_TABLET | Freq: Every day | ORAL | Status: DC

## 2012-12-06 NOTE — Assessment & Plan Note (Signed)
Certainly is anxious, he does have a major component of depression. Adding citalopram, I'll see him back in 2 weeks. PHQ9 each visit.

## 2012-12-06 NOTE — Progress Notes (Signed)
  Subjective:    CC: Establish care.   HPI: Philip Cain is a very pleasant 67 year old male with a multitude of medical problems who recently moved to the area. His most pressing concerns are as below:  Cough: Present for years, does have a history of COPD has been seen in the pulmonology clinic by Dr. Sherene Sires.  He is currently on Symbicort, and occasional bronchodilators. Unfortunately he notes that his cough has been fairly persistent, and is associated with mild shortness of breath. He has also been treated by intranasal steroids which have been ineffective for controlling his cough.  He denies any chest pain, has only mild shortness of breath currently, and denies any constitutional symptoms. He has been compliant with his Symbicort using 2 puffs twice a day. Sputum production is minimal. He has no sick contacts.  Energy: Notes a poor level of energy to the point where he does not even want to get up to leave the house. Mood is depressed, concentration is poor, sleep is poor, he is wondering if he might be depressed. Symptoms have been present for a long time. He was placed on a benzodiazepine which is not effective for his symptoms.  Past medical history, Surgical history, Family history not pertinant except as noted below, Social history, Allergies, and medications have been entered into the medical record, reviewed, and no changes needed.   Review of Systems: No headache, visual changes, nausea, vomiting, diarrhea, constipation, dizziness, abdominal pain, skin rash, fevers, chills, night sweats, swollen lymph nodes, weight loss, chest pain, body aches, joint swelling, muscle aches, shortness of breath, mood changes, visual or auditory hallucinations.  Objective:    General: Well Developed, well nourished, and in no acute distress.  Neuro: Alert and oriented x3, extra-ocular muscles intact, sensation grossly intact.  HEENT: Normocephalic, atraumatic, pupils equal round reactive to light, neck supple,  no masses, no lymphadenopathy, thyroid nonpalpable.  Skin: Warm and dry, no rashes noted.  Cardiac: Regular rate and rhythm, no murmurs rubs or gallops.  Respiratory: Clear to auscultation bilaterally. Not using accessory muscles, speaking in full sentences.  Abdominal: Soft, nontender, nondistended, positive bowel sounds, no masses, no organomegaly.  Musculoskeletal: Shoulder, elbow, wrist, hip, knee, ankle stable, and with full range of motion.  Impression and Recommendations:    The patient was counselled, risk factors were discussed, anticipatory guidance given.  He was amenable to discussing further medical issues in subsequent appointments.

## 2012-12-06 NOTE — Assessment & Plan Note (Addendum)
Currently mild exacerbation. Per patient, symptom control has been only moderate for over a year. Continue Symbicort, samples given today. Adding Singulair. Treated with prednisone and doxycycline. Chest x-ray. Return in a couple of weeks.  His last chest CT was over a decade ago for an acute issue.

## 2012-12-07 MED ORDER — BENZONATATE 200 MG PO CAPS: 200.0000 mg | ORAL_CAPSULE | Freq: Three times a day (TID) | ORAL | Status: DC | PRN

## 2012-12-21 ENCOUNTER — Ambulatory Visit (INDEPENDENT_AMBULATORY_CARE_PROVIDER_SITE_OTHER): Payer: Medicare Other | Admitting: Sports Medicine

## 2012-12-21 ENCOUNTER — Other Ambulatory Visit: Payer: Self-pay | Admitting: Sports Medicine

## 2012-12-21 VITALS — BP 147/82 | HR 94 | Wt 368.0 lb

## 2012-12-21 DIAGNOSIS — F341 Dysthymic disorder: Secondary | ICD-10-CM

## 2012-12-21 DIAGNOSIS — L989 Disorder of the skin and subcutaneous tissue, unspecified: Secondary | ICD-10-CM | POA: Insufficient documentation

## 2012-12-21 DIAGNOSIS — E1165 Type 2 diabetes mellitus with hyperglycemia: Secondary | ICD-10-CM

## 2012-12-21 DIAGNOSIS — F419 Anxiety disorder, unspecified: Secondary | ICD-10-CM

## 2012-12-21 DIAGNOSIS — J449 Chronic obstructive pulmonary disease, unspecified: Secondary | ICD-10-CM

## 2012-12-21 DIAGNOSIS — F32A Depression, unspecified: Secondary | ICD-10-CM

## 2012-12-21 DIAGNOSIS — IMO0001 Reserved for inherently not codable concepts without codable children: Secondary | ICD-10-CM

## 2012-12-21 DIAGNOSIS — E039 Hypothyroidism, unspecified: Secondary | ICD-10-CM

## 2012-12-21 DIAGNOSIS — IMO0002 Reserved for concepts with insufficient information to code with codable children: Secondary | ICD-10-CM

## 2012-12-21 DIAGNOSIS — J4489 Other specified chronic obstructive pulmonary disease: Secondary | ICD-10-CM

## 2012-12-21 DIAGNOSIS — E785 Hyperlipidemia, unspecified: Secondary | ICD-10-CM

## 2012-12-21 MED ORDER — PHENTERMINE HCL 37.5 MG PO CAPS: 37.5000 mg | ORAL_CAPSULE | ORAL | Status: AC

## 2012-12-21 MED ORDER — INSULIN GLARGINE 100 UNIT/ML SOLOSTAR PEN: 30.0000 [IU] | PEN_INJECTOR | Freq: Every day | SUBCUTANEOUS | Status: DC

## 2012-12-21 MED ORDER — FENOFIBRATE 160 MG PO TABS: 160.0000 mg | ORAL_TABLET | Freq: Every day | ORAL | Status: AC

## 2012-12-21 MED ORDER — CITALOPRAM HYDROBROMIDE 40 MG PO TABS: 40.0000 mg | ORAL_TABLET | Freq: Every day | ORAL | Status: DC

## 2012-12-21 MED ORDER — TAMSULOSIN HCL 0.4 MG PO CAPS: 0.4000 mg | ORAL_CAPSULE | Freq: Every day | ORAL | Status: DC

## 2012-12-21 MED ORDER — LEVOTHYROXINE SODIUM 50 MCG PO TABS: 50.0000 ug | ORAL_TABLET | Freq: Every day | ORAL | Status: AC

## 2012-12-21 MED ORDER — AMBULATORY NON FORMULARY MEDICATION: Status: DC

## 2012-12-21 NOTE — Assessment & Plan Note (Signed)
Refilling levothyroxine

## 2012-12-21 NOTE — Assessment & Plan Note (Addendum)
Increase Celexa to 40 mg daily.

## 2012-12-21 NOTE — Assessment & Plan Note (Signed)
Refilling phentermine. 

## 2012-12-21 NOTE — Assessment & Plan Note (Signed)
At this point we have had insufficient control on Novolin, and Humulin. I'm going to switch his entire regimen to Lantus. We will start with one Lantus shot per day, 30 units. He will increase by 2 units every morning his fasting blood sugar is more than 120. I have provided him with to Lantus Solostar pens, I will also provide a prescription for the needles.

## 2012-12-21 NOTE — Progress Notes (Addendum)
  Subjective:    CC: Followup  HPI: Anxiety/depression: Uncontrolled on citalopram 20.  Diabetes mellitus type 2: Uncontrolled on current insulin regimen.  Hyperlipidemia: Needs refill on fenofibrate.  Hypothyroidism:  Needs refill on levothyroxine.  Obesity: Needs refill on phentermine.  Skin lesion: Wants this removed, amenable to doing this at a subsequent visit.  COPD: Well controlled and asymptomatic after last exacerbation, needs refill on Symbicort.  Past medical history, Surgical history, Family history not pertinant except as noted below, Social history, Allergies, and medications have been entered into the medical record, reviewed, and no changes needed.   Review of Systems: No fevers, chills, night sweats, weight loss, chest pain, or shortness of breath.   Objective:    General: Well Developed, morbidly obese, and in no acute distress.  Neuro: Alert and oriented x3, extra-ocular muscles intact, sensation grossly intact.  HEENT: Normocephalic, atraumatic, pupils equal round reactive to light, neck supple, no masses, no lymphadenopathy, thyroid nonpalpable.  Skin: Warm and dry, no rashes. Cardiac: Regular rate and rhythm, no murmurs rubs or gallops, no lower extremity edema.  Respiratory: Clear to auscultation bilaterally. Not using accessory muscles, speaking in full sentences. Impression and Recommendations:    I spent over 40 minutes with this patient, greater than 50% was face-to-face counseling regarding the below diagnoses.

## 2012-12-21 NOTE — Assessment & Plan Note (Signed)
Excision will be performed at the next visit of a lesion on the right arm.

## 2012-12-21 NOTE — Assessment & Plan Note (Signed)
Refilling Symbicort with samples. Does pretty well on Symbicort and Singulair together.

## 2012-12-21 NOTE — Assessment & Plan Note (Signed)
Refilling fenofibrate 

## 2012-12-27 ENCOUNTER — Encounter: Payer: Self-pay | Admitting: Sports Medicine

## 2012-12-27 ENCOUNTER — Ambulatory Visit (INDEPENDENT_AMBULATORY_CARE_PROVIDER_SITE_OTHER): Payer: Medicare Other | Admitting: Sports Medicine

## 2012-12-27 VITALS — BP 122/66 | HR 82 | Wt 362.0 lb

## 2012-12-27 DIAGNOSIS — L0291 Cutaneous abscess, unspecified: Secondary | ICD-10-CM

## 2012-12-27 DIAGNOSIS — E1165 Type 2 diabetes mellitus with hyperglycemia: Secondary | ICD-10-CM

## 2012-12-27 DIAGNOSIS — D239 Other benign neoplasm of skin, unspecified: Secondary | ICD-10-CM

## 2012-12-27 DIAGNOSIS — IMO0002 Reserved for concepts with insufficient information to code with codable children: Secondary | ICD-10-CM

## 2012-12-27 DIAGNOSIS — L0390 Cellulitis, unspecified: Secondary | ICD-10-CM | POA: Insufficient documentation

## 2012-12-27 DIAGNOSIS — D229 Melanocytic nevi, unspecified: Secondary | ICD-10-CM | POA: Insufficient documentation

## 2012-12-27 DIAGNOSIS — IMO0001 Reserved for inherently not codable concepts without codable children: Secondary | ICD-10-CM

## 2012-12-27 MED ORDER — TOBRAMYCIN-DEXAMETHASONE 0.3-0.1 % OP SUSP
1.0000 [drp] | OPHTHALMIC | Status: DC
Start: 2012-12-27 — End: 2013-04-20

## 2012-12-27 MED ORDER — DOXYCYCLINE HYCLATE 100 MG PO TABS: 100.0000 mg | ORAL_TABLET | Freq: Two times a day (BID) | ORAL | Status: AC

## 2012-12-27 MED ORDER — DAPAGLIFLOZIN PROPANEDIOL 10 MG PO TABS: 1.0000 | ORAL_TABLET | Freq: Every morning | ORAL | Status: DC

## 2012-12-27 MED ORDER — MUPIROCIN CALCIUM 2 % NA OINT: TOPICAL_OINTMENT | Freq: Two times a day (BID) | NASAL | Status: AC

## 2012-12-27 MED ORDER — INSULIN DETEMIR 100 UNIT/ML ~~LOC~~ SOLN: 40.0000 [IU] | Freq: Every day | SUBCUTANEOUS | Status: DC

## 2012-12-27 NOTE — Assessment & Plan Note (Addendum)
Doxycycline for 10 days. Local topical wound care. Bactroban. Refilling antibiotic eyedrop.

## 2012-12-27 NOTE — Progress Notes (Signed)
  Subjective:    CC: Followup  HPI: Mole on right forearm: Like this to be removed. This is been present for years, irritating, occasionally bleeds, he tends to pick at it. i size has been stable.  Lesion on nose: Somewhat painful, present for a few days, recurrent, typically resolved with antibiotics. Also has TobraDex that he says helps each time this recurs.  Diabetes mellitus type II: Currently on Lantus, just passed 30 units. Unfortunately blood sugars continue to run high, anywhere from 200-400 fasting in the mornings. He is nearly out of Lantus pens. No headaches, visual changes.  Past medical history, Surgical history, Family history not pertinant except as noted below, Social history, Allergies, and medications have been entered into the medical record, reviewed, and no changes needed.   Review of Systems: No fevers, chills, night sweats, weight loss, chest pain, or shortness of breath.   Objective:    General: Well Developed, well nourished, and in no acute distress.  Neuro: Alert and oriented x3, extra-ocular muscles intact, sensation grossly intact.  HEENT: Normocephalic, atraumatic, pupils equal round reactive to light, neck supple, no masses, no lymphadenopathy, thyroid nonpalpable.  Skin: Warm and dry, no rashes. Cardiac: Regular rate and rhythm, no murmurs rubs or gallops, no lower extremity edema.  Respiratory: Clear to auscultation bilaterally. Not using accessory muscles, speaking in full sentences.  Procedure:  Excision of 1cm right forearm lesion. Risks, benefits, and alternatives explained and consent obtained. Time out conducted. Surface prepped with alcohol. 5cc lidocaine with epinephine infiltrated in a field block. Adequate anesthesia ensured. Area prepped and draped in a sterile fashion. Excision performed with: 4 mm punch excisional biopsy performed of the lesion, this was sent off for biopsy. A single 4-0 Ethilon horizontal mattress suture was  placed. Hemostasis achieved. Pt stable.  Impression and Recommendations:

## 2012-12-27 NOTE — Assessment & Plan Note (Signed)
Still currently doing the upper titration of Lantus. He is finishing his pens, I'm going to transition to Levemir, we will start 40 units and have him increase by 2 units every morning his blood sugars greater than 120. I'm also adding [place].

## 2012-12-27 NOTE — Addendum Note (Signed)
Addended by: Monica Becton on: 12/27/2012 04:35 PM   Modules accepted: Orders

## 2012-12-27 NOTE — Assessment & Plan Note (Signed)
Nevus was surgically excised with a punch biopsy, a single horizontal mattress suture was placed. He will come back in one week for suture removal.

## 2013-01-03 ENCOUNTER — Encounter: Payer: Self-pay | Admitting: Sports Medicine

## 2013-01-03 ENCOUNTER — Ambulatory Visit (INDEPENDENT_AMBULATORY_CARE_PROVIDER_SITE_OTHER): Payer: Medicare Other | Admitting: Sports Medicine

## 2013-01-03 VITALS — BP 131/78 | HR 87 | Wt 364.0 lb

## 2013-01-03 DIAGNOSIS — L03211 Cellulitis of face: Secondary | ICD-10-CM

## 2013-01-03 DIAGNOSIS — E1165 Type 2 diabetes mellitus with hyperglycemia: Secondary | ICD-10-CM

## 2013-01-03 DIAGNOSIS — L0390 Cellulitis, unspecified: Secondary | ICD-10-CM

## 2013-01-03 DIAGNOSIS — IMO0001 Reserved for inherently not codable concepts without codable children: Secondary | ICD-10-CM

## 2013-01-03 DIAGNOSIS — L0201 Cutaneous abscess of face: Secondary | ICD-10-CM

## 2013-01-03 DIAGNOSIS — IMO0002 Reserved for concepts with insufficient information to code with codable children: Secondary | ICD-10-CM

## 2013-01-03 DIAGNOSIS — L989 Disorder of the skin and subcutaneous tissue, unspecified: Secondary | ICD-10-CM

## 2013-01-03 MED ORDER — INSULIN DETEMIR 100 UNIT/ML ~~LOC~~ SOLN: 80.0000 [IU] | Freq: Every day | SUBCUTANEOUS | Status: DC

## 2013-01-03 MED ORDER — LORCASERIN HCL 10 MG PO TABS: 1.0000 | ORAL_TABLET | Freq: Every day | ORAL | Status: AC

## 2013-01-03 NOTE — Assessment & Plan Note (Signed)
Starting Belviq. We will be checked away after one month, I am leaning towards referral for bariatric surgery.

## 2013-01-03 NOTE — Assessment & Plan Note (Signed)
Only on 40 units of Levemir. I want him to increase to 80 units every day. He will increase by 5 units every time his morning blood sugar is greater than 120. Return in one week to see how blood sugars are going.

## 2013-01-03 NOTE — Progress Notes (Signed)
  Subjective:    CC: Followup  HPI: Mole: Removed at the last visit, sutures are to be removed today, pathology showed benign seborrheic keratosis.  No cellulitis: Improving after Bactroban, doxycycline.  Diabetes mellitus type 2: Was previously on over 200 units per day Novolin. More recently I switched him over to Levemir. He is only on 40 units and his blood sugars continue to be in the 400s. He continues to take [place].  Past medical history, Surgical history, Family history not pertinant except as noted below, Social history, Allergies, and medications have been entered into the medical record, reviewed, and no changes needed.   Review of Systems: No fevers, chills, night sweats, weight loss, chest pain, or shortness of breath.   Objective:    General: Well Developed, well nourished, and in no acute distress.  Neuro: Alert and oriented x3, extra-ocular muscles intact, sensation grossly intact.  HEENT: Normocephalic, atraumatic, pupils equal round reactive to light, neck supple, no masses, no lymphadenopathy, thyroid nonpalpable.  Skin: Warm and dry, no rashes. Sutures removed from right forearm. No sign of bacterial infection. Healing appropriately. Clean dry and intact. Cardiac: Regular rate and rhythm, no murmurs rubs or gallops, no lower extremity edema.  Respiratory: Clear to auscultation bilaterally. Not using accessory muscles, speaking in full sentences. Impression and Recommendations:

## 2013-01-03 NOTE — Assessment & Plan Note (Signed)
Improving and crusting over.

## 2013-01-03 NOTE — Assessment & Plan Note (Signed)
Suture removed today. Biopsy results confirmed seborrheic keratosis.

## 2013-01-18 ENCOUNTER — Ambulatory Visit: Payer: Medicare Other | Admitting: Sports Medicine

## 2013-01-22 ENCOUNTER — Ambulatory Visit: Payer: Medicare Other | Admitting: Sports Medicine

## 2013-01-23 ENCOUNTER — Encounter: Payer: Self-pay | Admitting: Sports Medicine

## 2013-01-23 ENCOUNTER — Ambulatory Visit (INDEPENDENT_AMBULATORY_CARE_PROVIDER_SITE_OTHER): Payer: Medicare Other | Admitting: Sports Medicine

## 2013-01-23 VITALS — BP 144/77 | HR 89 | Wt 368.0 lb

## 2013-01-23 DIAGNOSIS — L0390 Cellulitis, unspecified: Secondary | ICD-10-CM

## 2013-01-23 DIAGNOSIS — E1165 Type 2 diabetes mellitus with hyperglycemia: Secondary | ICD-10-CM

## 2013-01-23 DIAGNOSIS — E119 Type 2 diabetes mellitus without complications: Secondary | ICD-10-CM

## 2013-01-23 DIAGNOSIS — L219 Seborrheic dermatitis, unspecified: Secondary | ICD-10-CM

## 2013-01-23 DIAGNOSIS — IMO0002 Reserved for concepts with insufficient information to code with codable children: Secondary | ICD-10-CM

## 2013-01-23 DIAGNOSIS — IMO0001 Reserved for inherently not codable concepts without codable children: Secondary | ICD-10-CM

## 2013-01-23 LAB — POCT GLYCOSYLATED HEMOGLOBIN (HGB A1C): Hemoglobin A1C: 10.8

## 2013-01-23 MED ORDER — INSULIN NPH (HUMAN) (ISOPHANE) 100 UNIT/ML ~~LOC~~ SUSP: 60.0000 [IU] | Freq: Three times a day (TID) | SUBCUTANEOUS | Status: AC

## 2013-01-23 MED ORDER — KETOCONAZOLE 2 % EX CREA: TOPICAL_CREAM | Freq: Two times a day (BID) | CUTANEOUS | Status: AC

## 2013-01-23 MED ORDER — METHOCARBAMOL 750 MG PO TABS: 750.0000 mg | ORAL_TABLET | Freq: Three times a day (TID) | ORAL | Status: AC

## 2013-01-23 NOTE — Assessment & Plan Note (Signed)
Resolved

## 2013-01-23 NOTE — Assessment & Plan Note (Signed)
Recently Nizoral

## 2013-01-23 NOTE — Assessment & Plan Note (Signed)
Continues to be uncontrolled, and was unable to afford Levemir and never called [place]. Hemoglobin A1c was greater than 10. He tells me he's using Humulin N, 60 units 3 times a day and his blood sugars have been in the low 120s. He desires to continue this, and if A1c is still uncontrolled he will try to the VGO device. He'll come back to see [place] monthly with reports of his blood sugars.

## 2013-01-23 NOTE — Progress Notes (Signed)
  Subjective:    CC: Followup  HPI: Diabetes mellitus type 2: We have tried Lantus, Levemir, he has been unable to afford both of these, but has not told us. He returns today having gone back to Humulin N, and tells [place] he is using 60 units 3 times a day. He tells me that his fasting blood sugars run 120s to 170s. He denies any headaches, visual changes, polydipsia, polyphagia, polyuria.  Seborrheic dermatitis: Desiring refill on Nizoral.  COPD: Desiring refills on Symbicort.  Past medical history, Surgical history, Family history not pertinant except as noted below, Social history, Allergies, and medications have been entered into the medical record, reviewed, and no changes needed.   Review of Systems: No fevers, chills, night sweats, weight loss, chest pain, or shortness of breath.   Objective:    General: Well Developed, well nourished, and in no acute distress.  Neuro: Alert and oriented x3, extra-ocular muscles intact, sensation grossly intact.  HEENT: Normocephalic, atraumatic, pupils equal round reactive to light, neck supple, no masses, no lymphadenopathy, thyroid nonpalpable.  Skin: Warm and dry, no rashes. Cardiac: Regular rate and rhythm, no murmurs rubs or gallops, no lower extremity edema.  Respiratory: Clear to auscultation bilaterally. Not using accessory muscles, speaking in full sentences.  Point-of-care hemoglobin A1c is greater than 10.  Impression and Recommendations:

## 2013-01-26 ENCOUNTER — Telehealth: Payer: Self-pay | Admitting: *Deleted

## 2013-01-26 NOTE — Telephone Encounter (Signed)
Nurse with [facility] calls and gave approval of patient Robaxin. Faxed approval to pharmacy to fill prescription. Barry Dienes, LPN

## 2013-02-26 ENCOUNTER — Ambulatory Visit: Payer: Medicare Other | Admitting: Sports Medicine

## 2013-04-19 ENCOUNTER — Telehealth: Payer: Self-pay | Admitting: Sports Medicine

## 2013-04-19 NOTE — Telephone Encounter (Signed)
Pt states that at last visit Dr told him if he needed to be put back on Xanax to call the office; [facility] has contacted [place]  about getting refills but  we have not responded.

## 2013-04-19 NOTE — Telephone Encounter (Signed)
Please see attached note.

## 2013-04-19 NOTE — Telephone Encounter (Signed)
I have never prescribed Xanax to this guy, if he wants it he needs to come talk to me about it.

## 2013-04-20 ENCOUNTER — Other Ambulatory Visit: Payer: Self-pay

## 2013-04-20 ENCOUNTER — Encounter: Payer: Self-pay | Admitting: Sports Medicine

## 2013-04-20 ENCOUNTER — Ambulatory Visit (INDEPENDENT_AMBULATORY_CARE_PROVIDER_SITE_OTHER): Payer: Self-pay | Admitting: Sports Medicine

## 2013-04-20 VITALS — BP 159/86 | HR 106 | Wt 374.0 lb

## 2013-04-20 DIAGNOSIS — I10 Essential (primary) hypertension: Secondary | ICD-10-CM

## 2013-04-20 DIAGNOSIS — F329 Major depressive disorder, single episode, unspecified: Secondary | ICD-10-CM

## 2013-04-20 DIAGNOSIS — L0390 Cellulitis, unspecified: Secondary | ICD-10-CM

## 2013-04-20 DIAGNOSIS — E039 Hypothyroidism, unspecified: Secondary | ICD-10-CM

## 2013-04-20 DIAGNOSIS — E785 Hyperlipidemia, unspecified: Secondary | ICD-10-CM

## 2013-04-20 DIAGNOSIS — F32A Depression, unspecified: Secondary | ICD-10-CM

## 2013-04-20 DIAGNOSIS — F341 Dysthymic disorder: Secondary | ICD-10-CM

## 2013-04-20 MED ORDER — RISPERIDONE 2 MG PO TABS: ORAL_TABLET | ORAL | Status: AC

## 2013-04-20 MED ORDER — CITALOPRAM HYDROBROMIDE 40 MG PO TABS: 40.0000 mg | ORAL_TABLET | Freq: Every day | ORAL | Status: AC

## 2013-04-20 MED ORDER — BENZONATATE 200 MG PO CAPS: 200.0000 mg | ORAL_CAPSULE | Freq: Three times a day (TID) | ORAL | Status: AC | PRN

## 2013-04-20 NOTE — Assessment & Plan Note (Signed)
Discontinuing Xanax. Adding risperidone. Resistant to psychiatry.

## 2013-04-20 NOTE — Telephone Encounter (Signed)
Patient was seen in office today.  

## 2013-04-20 NOTE — Progress Notes (Signed)
  Subjective:    CC: Upset  HPI: Philip Cain is a 67 year old male, he has been on Xanax in the past he tells me for posttraumatic stress disorder. He is extremely upset, irritable, he has been out of his Xanax for some time. He did not request refill until recently, we have never prescribed this medicine, and he was upset that he had to come in to discuss this first.  He endorses depressed mood, uric ability, poor sleep, poor concentration, they're having financial difficulties as usual, and he can not afford physician co-pays. He has been fired by her prior psychiatrist. He is not interested in evaluation by a mental health professional. He is not suicidal or homicidal.  Past medical history, Surgical history, Family history not pertinant except as noted below, Social history, Allergies, and medications have been entered into the medical record, reviewed, and no changes needed.   Review of Systems: No fevers, chills, night sweats, weight loss, chest pain, or shortness of breath.   Objective:    General: Well Developed, well nourished, and in no acute distress. Irritable, cursing. Neuro: Alert and oriented x3, extra-ocular muscles intact, sensation grossly intact.  HEENT: Normocephalic, atraumatic, pupils equal round reactive to light, neck supple, no masses, no lymphadenopathy, thyroid nonpalpable.  Skin: Warm and dry, no rashes. Cardiac: Regular rate and rhythm, no murmurs rubs or gallops, no lower extremity edema.  Respiratory: Clear to auscultation bilaterally. Not using accessory muscles, speaking in full sentences.   Impression and Recommendations:    I spent over 40 minutes with this patient, greater than 50% was face-to-face time counseling regarding PTSD and irritability.

## 2013-05-24 ENCOUNTER — Ambulatory Visit: Payer: Medicare Other | Admitting: Sports Medicine

## 2013-09-21 ENCOUNTER — Other Ambulatory Visit: Payer: Self-pay

## 2013-09-21 MED ORDER — TAMSULOSIN HCL 0.4 MG PO CAPS: 0.4000 mg | ORAL_CAPSULE | Freq: Every day | ORAL | Status: AC

## 2013-09-21 NOTE — Telephone Encounter (Signed)
Refill request for Flomax 0.4mg 

## 2013-10-19 ENCOUNTER — Other Ambulatory Visit: Payer: Self-pay | Admitting: Family Medicine

## 2013-10-19 NOTE — Telephone Encounter (Signed)
Med denied. Pt not longer seen at practice.

## 2014-07-17 IMAGING — CR DG CHEST 2V
2 series · 2 of 2 positions shown · non-contrast
Comparison: October 12, 2012.

CLINICAL DATA: Cough, chronic obstructive pulmonary disease

CHEST - 2 VIEW

[view not recorded (1 of 2)]
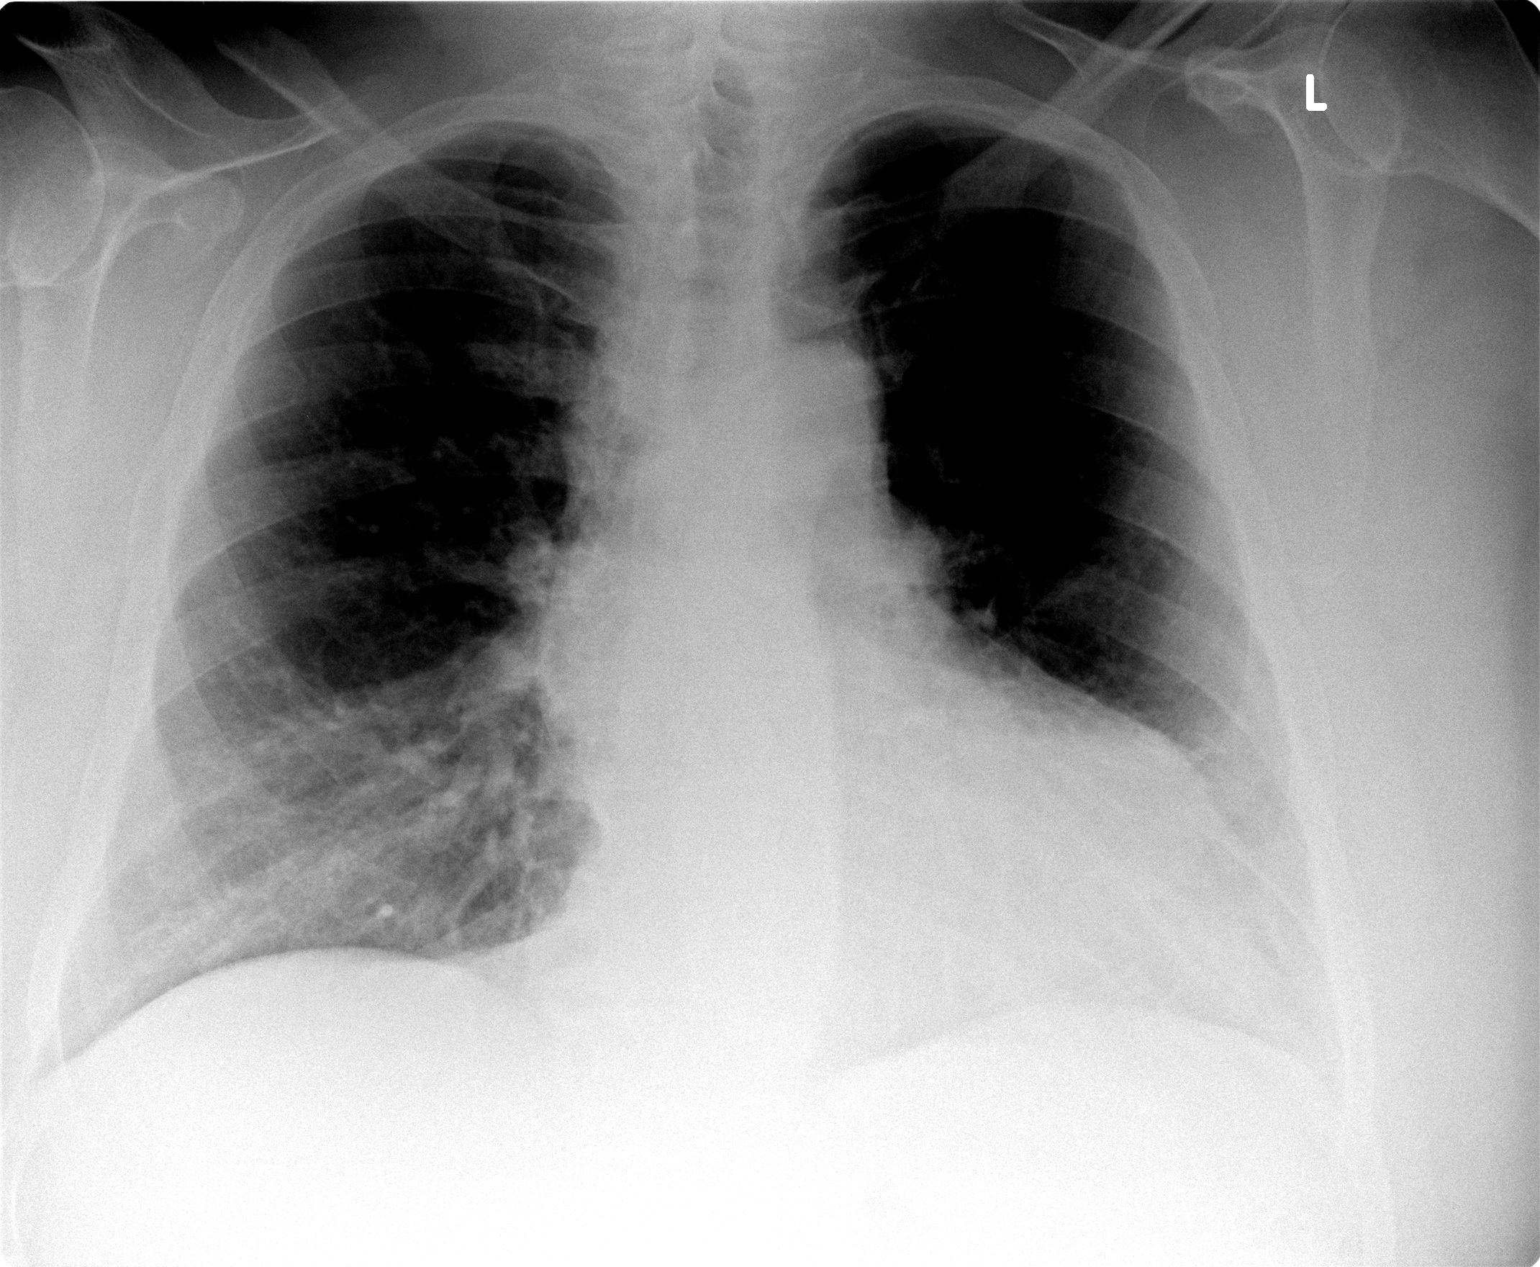

[view not recorded (2 of 2)]
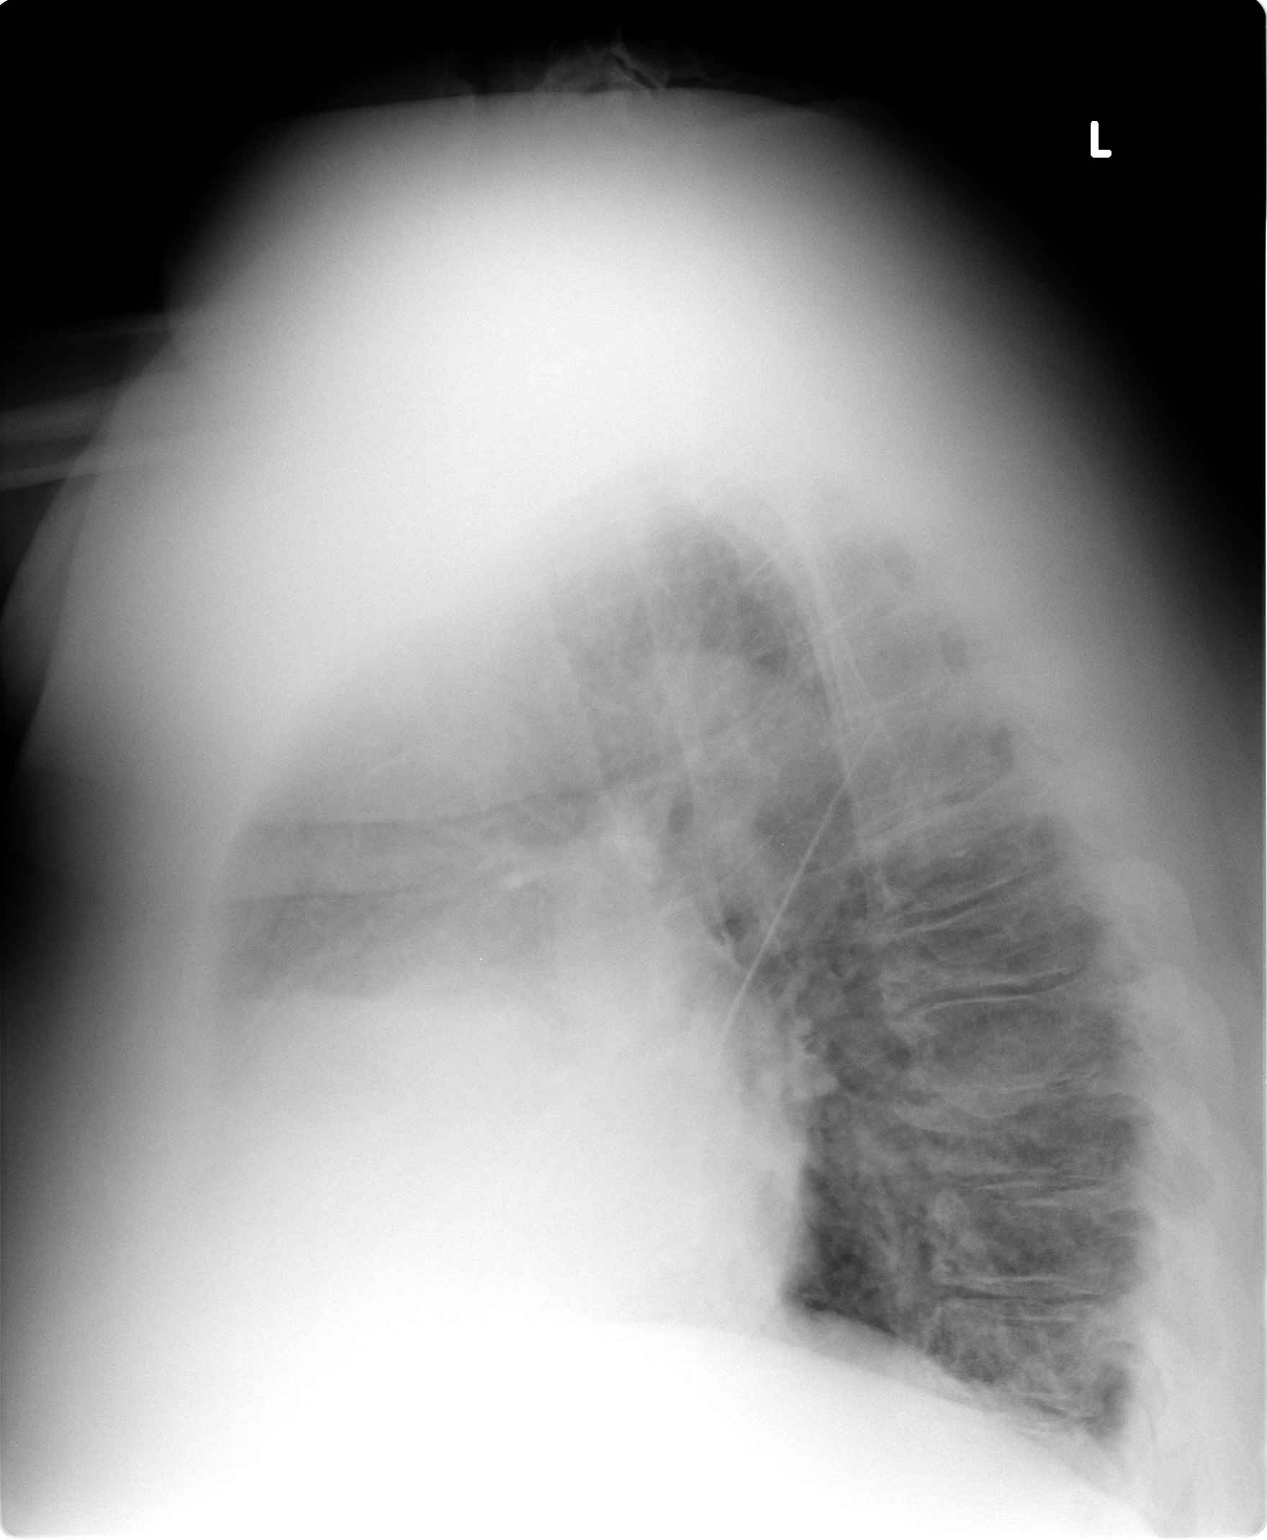

[2 of 2 positions shown; findings below may reference images not displayed]

FINDINGS: Stable mild cardiomegaly.  Chronic bronchitic thickening
is seen in both lung bases which is unchanged.  No acute pulmonary
disease is noted.  No pneumothorax or pleural effusion is noted.
Bony thorax is intact.
IMPRESSION: No acute cardiopulmonary abnormality seen.

## 2017-04-23 DEATH — deceased

## 2021-09-29 ENCOUNTER — Inpatient Hospital Stay: Admit: 2021-09-29 | Discharge: 2021-09-29 | Payer: PRIVATE HEALTH INSURANCE | Attending: Emergency Medicine

## 2021-09-30 NOTE — ED Notes
8:15 PM Pt arrived by EMS with lucas on for compressions, intubated with 8.0 ~20 at the teeth, IO to LLE. [place], AED plaed with no shock advised. nitial call per EMS 1924. For EMS pt found to be in asystole on the monitor, given epi x 3 and NS. 8:16 PM Lucas removed, no pulses palpable. Dr. Mayer Masker performed bedside cardiac [place] with no cardiac activity done. 8:17 PM Time of death called by Dr. Mayer Masker. 8:56 PM NEDS notified, accepted. Case # [id number]. Dr. Mayer Masker notified.9:07 PM Medical examiner notified, declined per [facility]. Case # [id number]:14 PM Post mortem care performed by this RN and ERT.10:55 PM Pt transferred to morgue via transport.11:17 PM Wallet with $81 cash and cell phone secured by this RN and security and sent to security safe. Case Number [id number]. Unable to secure sooner d/t several security emergencies in department.

## 2021-09-30 NOTE — ED Provider Notes
 CHIEF COMPLAINTCardiac arrestHPI/MDMRobert Acuna is a 76 y.o. male  who presents to the ED c/o cardiac arrest.  The patient was found unresponsive in a chair on the [place] to [address].  CPR was initiated and AED on the [place] stated no shock advised.  EMS was initially called at 7:24 p.m. CPR continued as the Thad Ranger was turned around and return to the port of [employer], [place].  Paramedics and In ambulance assumed care of the patient.  Patient was noted to be asystole on the monitor.  CPR was continued with [facility] device.  Patient was given epinephrine 1 milligram x 3 doses.  Patient was intubated by paramedic.  IO was placed to the left lower extremity.  Patient was given normal saline by EMS.On arrival to the emergency department I see the patient immediately.  He has no spontaneous respirations, no pulse, and distally on the monitor.Bedside cardiac ultrasound performed myself demonstrates no cardiac wall motion.Patient has been in cardiac arrest for approximately 50 minutes.  He remains in asystole.Time of death is called at 8:17 p.m.I contacted the patient's wife by phone and informed her of the patient's death.Medical examiner was contacted by RN.  Medical examiner declines patient has a case.History is obtained from EMS and I have reviewed notes in epic specifically cardiology office note from 09/22/2021.  Per note patient has a history of right bundle-branch block, hypertension, and GERDPartial differential diagnosis is broad and includes myocardial infarction, catastrophic intracranial hemorrhage, pulmonary embolus, aortic dissection, aortic aneurysm, etceteraGiven patient's history of hypertension and possible coronary disease I do feel that atherosclerotic coronary artery disease is most likely cause of death.PMH/PSH/FMH/Social Hx, medications and allergies are reviewed as documented in nursing notes.PHYSICAL EXAMVitals:   2016 Pulse: (!) 0 Resp: (!) 0 HEENT:  Pupils equal 4 millimeters nonreactive, moist mucous membranes, endotracheal tubeNeck:  Supple, trachea midlineRespiratory:  No spontaneous respirationsCardiac no pulses, CPR by Samuel Bouche device Extremities:  No cyanosis, or edema, hands cool to the touchNeuro:  Unresponsive, GCS 3LABS I have reviewed all available labs for this visit prior to disposition.No results found for this visit on .RADIOLOGY  No results found.ED COURSERelevant old records, labs and imaging are reviewed.  During the patient's ED course, the patient was given:Medications - No data to display CLINICAL IMPRESSION  SNOMED [place](R) 1. Cardiac arrest (HC Code)  CARDIAC ARREST DISPOSITIONRobert Caster was pronounced dead at 8:17 p.m.DISCLAIMER: This chart was created using M-Modal dictation software. Efforts were made by me to ensure accuracy, however some errors may be present due to limitations of this technology and occasionally words are not transcribed as intended. Hilliard Clark, Cam[place] Pedigo, MD 2249

## 2021-09-30 NOTE — ED Notes
10:57 PM

## 2021-10-21 DEATH — deceased
